# Patient Record
Sex: Female | Born: 1960 | Race: Black or African American | Hispanic: No | Marital: Married | Smoking: Never smoker
Health system: Southern US, Community
[De-identification: ages and names within clinical notes are randomized; demographics above are authoritative.]

## PROBLEM LIST (undated history)

## (undated) DIAGNOSIS — M1990 Unspecified osteoarthritis, unspecified site: Secondary | ICD-10-CM

## (undated) DIAGNOSIS — B019 Varicella without complication: Secondary | ICD-10-CM

## (undated) DIAGNOSIS — I5189 Other ill-defined heart diseases: Secondary | ICD-10-CM

## (undated) DIAGNOSIS — I10 Essential (primary) hypertension: Secondary | ICD-10-CM

## (undated) DIAGNOSIS — R0989 Other specified symptoms and signs involving the circulatory and respiratory systems: Secondary | ICD-10-CM

## (undated) DIAGNOSIS — G43909 Migraine, unspecified, not intractable, without status migrainosus: Secondary | ICD-10-CM

## (undated) HISTORY — DX: Other specified symptoms and signs involving the circulatory and respiratory systems: R09.89

## (undated) HISTORY — DX: Other ill-defined heart diseases: I51.89

## (undated) HISTORY — DX: Essential (primary) hypertension: I10

## (undated) HISTORY — DX: Varicella without complication: B01.9

## (undated) HISTORY — DX: Migraine, unspecified, not intractable, without status migrainosus: G43.909

## (undated) HISTORY — PX: BREAST CYST ASPIRATION: SHX578

## (undated) HISTORY — PX: TUBAL LIGATION: SHX77

## (undated) HISTORY — DX: Unspecified osteoarthritis, unspecified site: M19.90

---

## 2004-02-29 ENCOUNTER — Ambulatory Visit: Payer: Self-pay | Admitting: Obstetrics and Gynecology

## 2007-04-08 ENCOUNTER — Emergency Department (HOSPITAL_COMMUNITY): Admission: EM | Admit: 2007-04-08 | Discharge: 2007-04-08 | Payer: Self-pay | Admitting: Family Medicine

## 2007-07-22 ENCOUNTER — Emergency Department (HOSPITAL_COMMUNITY): Admission: EM | Admit: 2007-07-22 | Discharge: 2007-07-22 | Payer: Self-pay | Admitting: Emergency Medicine

## 2010-03-02 ENCOUNTER — Encounter: Payer: Self-pay | Admitting: Internal Medicine

## 2010-08-28 ENCOUNTER — Ambulatory Visit (INDEPENDENT_AMBULATORY_CARE_PROVIDER_SITE_OTHER): Payer: 59 | Admitting: Cardiovascular Disease

## 2010-08-28 ENCOUNTER — Encounter: Payer: Self-pay | Admitting: Cardiovascular Disease

## 2010-08-28 DIAGNOSIS — R0989 Other specified symptoms and signs involving the circulatory and respiratory systems: Secondary | ICD-10-CM | POA: Insufficient documentation

## 2010-08-28 DIAGNOSIS — R011 Cardiac murmur, unspecified: Secondary | ICD-10-CM | POA: Insufficient documentation

## 2010-08-28 NOTE — Patient Instructions (Addendum)
You are doing well. No medication changes were made. We will schedule an echocardiogram to look for the cause of your murmur and bruit. Please call us if you have new issues that need to be addressed   Your physician has requested that you have an echocardiogram. Echocardiography is a painless test that uses sound waves to create images of your heart. It provides your doctor with information about the size and shape of your heart and how well your heart's chambers and valves are working. This procedure takes approximately one hour. There are no restrictions for this procedure.

## 2010-08-28 NOTE — Assessment & Plan Note (Signed)
There is clearly a murmur appreciated on exam though uncertain if this is an outflow tract is a flow murmur or something else. She does not have any significant symptoms. Murmur extends into the sternal notch. Echocardiogram has been ordered.

## 2010-08-28 NOTE — Assessment & Plan Note (Signed)
Her bruit seems to extend into the sternal notch and upwards. We have ordered a echocardiogram to evaluate her valves and source of murmur and look into the subclavian and basal carotid region.

## 2010-08-28 NOTE — Progress Notes (Signed)
Patient ID: Tara Marshall, female    DOB: August 29, 1960, 50 y.o.   MRN: [id number]  HPI Comments: Tara Marshall is a very pleasant 50 year old woman with a history of vitamin D deficiency, patient of Dr. Carlynn Purl, who presents by referral for evaluation of a murmur and bruit.  She reports that she has had some swelling in her right foot. This gets worse if she spends long periods on her feet. She does have occasional palpitations. She also feels sometimes having to take a deep breath though no shortness of breath with exertion. Her level of activity has been stable, and she is able to do everything that she would like to do. No lightheadedness or dizziness. Otherwise she feels well.  EKG shows normal sinus rhythm with rate 72 beats per minute with no significant ST or T wave changes  She does report that her mother had syncope and heart attack at age 35, father died at age 9, possibly from heart related issue   Outpatient Encounter Prescriptions as of 08/28/2010  Medication Sig Dispense Refill  . calcium carbonate (OS-CAL) 600 MG TABS Take 600 mg by mouth daily.        . Cholecalciferol (VITAMIN D HIGH POTENCY) 1000 UNITS capsule Take 1,000 Units by mouth daily.        . meloxicam (MOBIC) 15 MG tablet Take 15 mg by mouth daily.        . Omega-3 Fatty Acids (FISH OIL) 1000 MG CAPS Take 1,000 mg by mouth 1 day or 1 dose.           Review of Systems  Constitutional: Negative.   HENT: Negative.   Eyes: Negative.   Respiratory: Negative.   Cardiovascular: Negative.        Right foot swelling, occasional shortness of breath at rest  Gastrointestinal: Negative.   Musculoskeletal: Negative.   Skin: Negative.   Neurological: Negative.   Hematological: Negative.   Psychiatric/Behavioral: Negative.   All other systems reviewed and are negative.    BP 152/91  Pulse 91  Ht 5\' 4"  (1.626 m)  Wt 220 lb (99.791 kg)  BMI 37.76 kg/m2   Physical Exam  Nursing note and vitals  reviewed. Constitutional: She is oriented to person, place, and time. She appears well-developed and well-nourished.  HENT:  Head: Normocephalic.  Nose: Nose normal.  Mouth/Throat: Oropharynx is clear and moist.  Eyes: Conjunctivae are normal. Pupils are equal, round, and reactive to light.  Neck: Normal range of motion. Neck supple. No JVD present.  Cardiovascular: Normal rate, regular rhythm, S1 normal, S2 normal and intact distal pulses.  Exam reveals no gallop and no friction rub.   Murmur heard.  Crescendo systolic murmur is present with a grade of 2/6       Murmur appreciated best at the sternal notch, extending into the left clavicular region  Pulmonary/Chest: Effort normal and breath sounds normal. No respiratory distress. She has no wheezes. She has no rales. She exhibits no tenderness.  Abdominal: Soft. Bowel sounds are normal. She exhibits no distension. There is no tenderness.  Musculoskeletal: Normal range of motion. She exhibits no edema and no tenderness.  Lymphadenopathy:    She has no cervical adenopathy.  Neurological: She is alert and oriented to person, place, and time. Coordination normal.  Skin: Skin is warm and dry. No rash noted. No erythema.  Psychiatric: She has a normal mood and affect. Her behavior is normal. Judgment and thought content normal.  Assessment and Plan

## 2010-09-12 ENCOUNTER — Other Ambulatory Visit (INDEPENDENT_AMBULATORY_CARE_PROVIDER_SITE_OTHER): Payer: 59 | Admitting: *Deleted

## 2010-09-12 DIAGNOSIS — R011 Cardiac murmur, unspecified: Secondary | ICD-10-CM

## 2010-09-12 DIAGNOSIS — R0989 Other specified symptoms and signs involving the circulatory and respiratory systems: Secondary | ICD-10-CM

## 2010-09-24 ENCOUNTER — Telehealth: Payer: Self-pay | Admitting: Cardiovascular Disease

## 2010-09-24 NOTE — Telephone Encounter (Signed)
Would like results of ECHO.

## 2010-09-25 NOTE — Telephone Encounter (Signed)
Please review echo and advise, thanks.

## 2011-08-11 DIAGNOSIS — M19079 Primary osteoarthritis, unspecified ankle and foot: Secondary | ICD-10-CM | POA: Insufficient documentation

## 2011-08-11 DIAGNOSIS — M549 Dorsalgia, unspecified: Secondary | ICD-10-CM | POA: Insufficient documentation

## 2011-08-11 DIAGNOSIS — M722 Plantar fascial fibromatosis: Secondary | ICD-10-CM | POA: Insufficient documentation

## 2012-05-24 ENCOUNTER — Ambulatory Visit: Payer: Self-pay | Admitting: Gastroenterology

## 2014-06-11 LAB — HM COLONOSCOPY

## 2014-07-12 ENCOUNTER — Ambulatory Visit (INDEPENDENT_AMBULATORY_CARE_PROVIDER_SITE_OTHER)
Admission: RE | Admit: 2014-07-12 | Discharge: 2014-07-12 | Disposition: A | Discharge status: Home / Self Care | Payer: Managed Care, Other (non HMO) | Source: Ambulatory Visit | Attending: Primary Care | Admitting: Primary Care

## 2014-07-12 ENCOUNTER — Encounter: Payer: Self-pay | Admitting: Primary Care

## 2014-07-12 ENCOUNTER — Ambulatory Visit (INDEPENDENT_AMBULATORY_CARE_PROVIDER_SITE_OTHER): Payer: Managed Care, Other (non HMO) | Admitting: Primary Care

## 2014-07-12 VITALS — BP 160/102 | HR 67 | Temp 98.0°F | Ht 64.0 in | Wt 233.8 lb

## 2014-07-12 DIAGNOSIS — I10 Essential (primary) hypertension: Secondary | ICD-10-CM

## 2014-07-12 DIAGNOSIS — M5442 Lumbago with sciatica, left side: Secondary | ICD-10-CM

## 2014-07-12 HISTORY — DX: Lumbago with sciatica, left side: M54.42

## 2014-07-12 LAB — BASIC METABOLIC PANEL
BUN: 12 mg/dL (ref 6–23)
CALCIUM: 9.6 mg/dL (ref 8.4–10.5)
CO2: 30 mEq/L (ref 19–32)
Chloride: 103 mEq/L (ref 96–112)
Creatinine, Ser: 0.8 mg/dL (ref 0.40–1.20)
GFR: 95.34 mL/min (ref >60.00)
Glucose, Bld: 74 mg/dL (ref 70–99)
Potassium: 3.8 mEq/L (ref 3.5–5.1)
SODIUM: 139 meq/L (ref 135–145)

## 2014-07-12 MED ORDER — HYDROCHLOROTHIAZIDE 25 MG PO TABS: 25.0000 mg | ORAL_TABLET | Freq: Every day | ORAL | Status: DC

## 2014-07-12 NOTE — Assessment & Plan Note (Signed)
Reports historically elevated in past. Elevated today in clinic. Due to obesity and historical readings will start medication. HCTZ 25 mg daily as this should help with lower extremity edema. Discouraged diet pills and suggested a lower dose ibuprofen as this is likely contributing to her hypertension. BMP unremarkable today. Follow up in 2 weeks.

## 2014-07-12 NOTE — Progress Notes (Signed)
Pre visit review using our clinic review tool, if applicable. No additional management support is needed unless otherwise documented below in the visit note. 

## 2014-07-12 NOTE — Patient Instructions (Addendum)
Start Hydrochlorothiazide tablets for blood pressure. Take 1 tablet by mouth daily. Complete xray(s) prior to leaving today. I will contact you regarding your results. Complete lab work prior to leaving today. I will notify you of your results. It is important that you improve your diet. Please limit carbohydrates in the form of white bread, rice, pasta, cakes, cookies, sugary drinks, etc. Increase your consumption of fresh fruits and vegetables. Be sure to drink plenty of water daily.  Follow up in 2 weeks for evaluation of blood pressure.  It was a pleasure to meet you today! Please don't hesitate to call me with any questions. Welcome to [facility]!  Hypertension Hypertension, commonly called high blood pressure, is when the force of blood pumping through your arteries is too strong. Your arteries are the blood vessels that carry blood from your heart throughout your body. A blood pressure reading consists of a higher number over a lower number, such as 110/72. The higher number (systolic) is the pressure inside your arteries when your heart pumps. The lower number (diastolic) is the pressure inside your arteries when your heart relaxes. Ideally you want your blood pressure below 120/80. Hypertension forces your heart to work harder to pump blood. Your arteries may become narrow or stiff. Having hypertension puts you at risk for heart disease, stroke, and other problems.  RISK FACTORS Some risk factors for high blood pressure are controllable. Others are not.  Risk factors you cannot control include:   Race. You may be at higher risk if you are African American.  Age. Risk increases with age.  Gender. Men are at higher risk than women before age 56 years. After age 56, women are at higher risk than men. Risk factors you can control include:  Not getting enough exercise or physical activity.  Being overweight.  Getting too much fat, sugar, calories, or salt in your diet.  Drinking too  much alcohol. SIGNS AND SYMPTOMS Hypertension does not usually cause signs or symptoms. Extremely high blood pressure (hypertensive crisis) may cause headache, anxiety, shortness of breath, and nosebleed. DIAGNOSIS  To check if you have hypertension, your health care provider will measure your blood pressure while you are seated, with your arm held at the level of your heart. It should be measured at least twice using the same arm. Certain conditions can cause a difference in blood pressure between your right and left arms. A blood pressure reading that is higher than normal on one occasion does not mean that you need treatment. If one blood pressure reading is high, ask your health care provider about having it checked again. TREATMENT  Treating high blood pressure includes making lifestyle changes and possibly taking medicine. Living a healthy lifestyle can help lower high blood pressure. You may need to change some of your habits. Lifestyle changes may include:  Following the DASH diet. This diet is high in fruits, vegetables, and whole grains. It is low in salt, red meat, and added sugars.  Getting at least 2 hours of brisk physical activity every week.  Losing weight if necessary.  Not smoking.  Limiting alcoholic beverages.  Learning ways to reduce stress. If lifestyle changes are not enough to get your blood pressure under control, your health care provider may prescribe medicine. You may need to take more than one. Work closely with your health care provider to understand the risks and benefits. HOME CARE INSTRUCTIONS  Have your blood pressure rechecked as directed by your health care provider.   Take  medicines only as directed by your health care provider. Follow the directions carefully. Blood pressure medicines must be taken as prescribed. The medicine does not work as well when you skip doses. Skipping doses also puts you at risk for problems.   Do not smoke.   Monitor  your blood pressure at home as directed by your health care provider. SEEK MEDICAL CARE IF:   You think you are having a reaction to medicines taken.  You have recurrent headaches or feel dizzy.  You have swelling in your ankles.  You have trouble with your vision. SEEK IMMEDIATE MEDICAL CARE IF:  You develop a severe headache or confusion.  You have unusual weakness, numbness, or feel faint.  You have severe chest or abdominal pain.  You vomit repeatedly.  You have trouble breathing. MAKE SURE YOU:   Understand these instructions.  Will watch your condition.  Will get help right away if you are not doing well or get worse. Document Released: 01/26/2005 Document Revised: 06/12/2013 Document Reviewed: 11/18/2012 [facility] Patient Information 2015 [place], [place]. This information is not intended to replace advice given to you by your health care provider. Make sure you discuss any questions you have with your health care provider.

## 2014-07-12 NOTE — Progress Notes (Signed)
Subjective:    Patient ID: Tara Marshall, female    DOB: 04/22/1960, 54 y.o.   MRN: [id number]  HPI  Ms. Tara Marshall is a 54 year old female who presents today to establish care and discuss the problems mentioned below. Will obtain old records.  1) Elevated blood pressure readings: She's started taking over the counter diet pills this past Monday. She's also been taking ibuprofen and OTC "joint medication" every 6 hours consistently for several years for her joint pain. She's had elevated readings in the past and was treated with medication for a short period of time. Last BP check was one year ago and reports it was not normal. She does not check her BP at home.  BP Readings from Last 3 Encounters:  07/12/14 160/102  08/28/10 152/91    2) Leg pain: Present to left leg throughout the day and will experience worse pain with radiation down left leg when laying down. She describes her pain as "achy".She also reports pain to right 4th and 5th metatarsals with radiation to ankle. She's also noticed swelling to right ankle 1 week ago. She's taking ibuprofen 800 mg in the morning, 800 mg in the evening and Motrin PM at night for the past several years and moreso especially in the past several weeks. She will experience left lower back cramping. She was once told that she had a torn tendon to her right leg and was recommended she undergo surgery for which she declined.  3) Obesity: Steady weight gain over the years. She tries to walk 3 laps around a local park daily. She's just restarted Garsina Cambogia diet pills on Monday. She's been on diet pills intermittently throughout the years. Her diet consists of: Breakfast: cereal, toast with butter Lunch: crackers, grapes Dinner: fish, chicken, vegetables Drinks: sodas (1-2 daily), mostly sweet tea, some water.  Body mass index is 40.11 kg/(m^2).   Review of Systems  Constitutional: Negative for unexpected weight change.  HENT: Negative for rhinorrhea.    Respiratory: Negative for cough and shortness of breath.   Cardiovascular: Negative for chest pain.  Gastrointestinal: Negative for diarrhea and constipation.  Genitourinary: Negative for dysuria and frequency.  Musculoskeletal: Positive for arthralgias.  Skin: Negative for rash.  Neurological: Negative for dizziness and headaches.  Psychiatric/Behavioral:       Denies concerns for anxiety or depression       Past Medical History  Diagnosis Date  . Carotid bruit     right   . Other ill-defined heart disease   . Arthritis   . Chicken pox   . Hypertension   . Migraine     History   Social History  . Marital Status: Married    Spouse Name: N/A  . Number of Children: N/A  . Years of Education: N/A   Occupational History  . Not on file.   Social History Main Topics  . Smoking status: Never Smoker   . Smokeless tobacco: Not on file  . Alcohol Use: 0.6 oz/week    1 Standard drinks or equivalent per week  . Drug Use: No  . Sexual Activity: Not on file   Other Topics Concern  . Not on file   Social History Narrative   Married.   3 children   Highest level of education 12th grade.   Works as a Arts development officerhome maker.   Enjoys playing on the computer, walking, organizing.    Past Surgical History  Procedure Laterality Date  . Cesarean section    .  Tubal ligation      Family History  Problem Relation Age of Onset  . Heart attack Mother   . Hypertension Mother     No Known Allergies  Current Outpatient Prescriptions on File Prior to Visit  Medication Sig Dispense Refill  . calcium carbonate (OS-CAL) 600 MG TABS Take 600 mg by mouth daily.      . Cholecalciferol (VITAMIN D HIGH POTENCY) 1000 UNITS capsule Take 1,000 Units by mouth daily.      . Omega-3 Fatty Acids (FISH OIL) 1000 MG CAPS Take 1,000 mg by mouth 1 day or 1 dose.      . meloxicam (MOBIC) 15 MG tablet Take 15 mg by mouth daily.       No current facility-administered medications on file prior to visit.      BP 160/102 mmHg  Pulse 67  Temp(Src) 98 F (36.7 C) (Oral)  Ht  (1.626 m)  Wt 233 lb 12.8 oz (106.051 kg)  BMI 40.11 kg/m2  SpO2 97%    Objective:   Physical Exam  Constitutional: She is oriented to person, place, and time. She appears well-nourished.  HENT:  Head: Normocephalic.  Cardiovascular: Normal rate and regular rhythm.   Pulmonary/Chest: Effort normal and breath sounds normal.  Musculoskeletal: She exhibits no edema.       Left upper leg: She exhibits no tenderness and no swelling.       Left lower leg: She exhibits no tenderness and no swelling.  No pain on PROM. Negative straight leg raise.  Neurological: She is alert and oriented to person, place, and time.  Skin: Skin is warm and dry.  Psychiatric: She has a normal mood and affect.          Assessment & Plan:

## 2014-07-12 NOTE — Assessment & Plan Note (Signed)
Present for years. Takes daily ibuprofen with some relief. Xrays today of lumbar spine which showed disc degeneration to L4-L5, L5-S1. Pain also due to body habitus and explained she needed to lose weight. Offered physical therapy, awaiting reply.

## 2014-07-26 ENCOUNTER — Ambulatory Visit: Payer: Managed Care, Other (non HMO) | Admitting: Primary Care

## 2014-08-06 ENCOUNTER — Encounter: Payer: Self-pay | Admitting: Primary Care

## 2014-08-06 ENCOUNTER — Ambulatory Visit (INDEPENDENT_AMBULATORY_CARE_PROVIDER_SITE_OTHER): Payer: Managed Care, Other (non HMO) | Admitting: Primary Care

## 2014-08-06 VITALS — BP 142/84 | HR 74 | Temp 97.4°F | Ht 64.0 in | Wt 227.8 lb

## 2014-08-06 DIAGNOSIS — I10 Essential (primary) hypertension: Secondary | ICD-10-CM | POA: Diagnosis not present

## 2014-08-06 DIAGNOSIS — M25569 Pain in unspecified knee: Secondary | ICD-10-CM

## 2014-08-06 HISTORY — DX: Pain in unspecified knee: M25.569

## 2014-08-06 MED ORDER — AMLODIPINE BESYLATE 5 MG PO TABS: 5.0000 mg | ORAL_TABLET | Freq: Every day | ORAL | Status: DC

## 2014-08-06 NOTE — Assessment & Plan Note (Signed)
Improved. Not yet at goal. Continue HCTZ 25 mg, add Amlodipine 5 mg daily. Continue to check BP throughout the week. Discussed for her to call if she felt dizziness or had a readings below 100/60. Follow up in 4 weeks for re-evaluation.

## 2014-08-06 NOTE — Progress Notes (Signed)
Subjective:    Patient ID: Tara Marshall, female    DOB: 03-11-1960, 53 y.o.   MRN: [id number]  HPI  Tara Marshall is a 54 year old female who presents today for follow up.  She was evaluated on 07/12/14 as a new patient and noted to have high blood pressure. She takes diet pills and ibuprofen regularly. This was discouraged as it was likely contributing to her elevated readings. She was once managed on HCTZ for elevated readings, and was restarted last visit at this same dose. Since the initiation of her medication her blood pressure has decreased as well as her lower extremity edema. She's been checking her blood pressure at [employer] and has been getting 150's-140/80's. Reports headaches have improved, denies chest pain.   BP Readings from Last 3 Encounters:  08/06/14 142/84  07/12/14 160/102  08/28/10 152/91   2) Left knee pain: Present since February 2016. Pain is worst after laying still for a prolonged amount of time. She has recently taken tylenol arthritis with relief. She is also taking Meloxicam for low back pain.   Wt Readings from Last 3 Encounters:  08/06/14 227 lb 12.8 oz (103.329 kg)  07/12/14 233 lb 12.8 oz (106.051 kg)  08/28/10 220 lb (99.791 kg)     Review of Systems  Respiratory: Negative for shortness of breath.   Cardiovascular: Negative for chest pain and leg swelling.  Musculoskeletal: Positive for arthralgias.  Neurological: Negative for dizziness.       Improvement in headaches       Past Medical History  Diagnosis Date  . Carotid bruit     right   . Other ill-defined heart disease   . Arthritis   . Chicken pox   . Hypertension   . Migraine     History   Social History  . Marital Status: Married    Spouse Name: N/A  . Number of Children: N/A  . Years of Education: N/A   Occupational History  . Not on file.   Social History Main Topics  . Smoking status: Never Smoker   . Smokeless tobacco: Not on file  . Alcohol Use: 0.6 oz/week    1  Standard drinks or equivalent per week  . Drug Use: No  . Sexual Activity: Not on file   Other Topics Concern  . Not on file   Social History Narrative   Married.   3 children   Highest level of education 12th grade.   Works as a Arts development officer.   Enjoys playing on the computer, walking, organizing.    Past Surgical History  Procedure Laterality Date  . Cesarean section    . Tubal ligation      Family History  Problem Relation Age of Onset  . Heart attack Mother   . Hypertension Mother     No Known Allergies  Current Outpatient Prescriptions on File Prior to Visit  Medication Sig Dispense Refill  . calcium carbonate (OS-CAL) 600 MG TABS Take 600 mg by mouth daily.      . Cholecalciferol (VITAMIN D HIGH POTENCY) 1000 UNITS capsule Take 1,000 Units by mouth daily.      Marland Kitchen glucosamine-chondroitin 500-400 MG tablet Take 2 tablets by mouth 3 (three) times daily.    . hydrochlorothiazide (HYDRODIURIL) 25 MG tablet Take 1 tablet (25 mg total) by mouth daily. 90 tablet 3  . Omega-3 Fatty Acids (FISH OIL) 1000 MG CAPS Take 1,000 mg by mouth 1 day or 1 dose.      Marland Kitchen  meloxicam (MOBIC) 15 MG tablet Take 15 mg by mouth daily.       No current facility-administered medications on file prior to visit.    BP 142/84 mmHg  Pulse 74  Temp(Src) 97.4 F (36.3 C) (Oral)  Ht  (1.626 m)  Wt 227 lb 12.8 oz (103.329 kg)  BMI 39.08 kg/m2  SpO2 97%    Objective:   Physical Exam  Cardiovascular: Normal rate and regular rhythm.   Pulmonary/Chest: Effort normal and breath sounds normal.  Musculoskeletal: Normal range of motion. She exhibits no edema or tenderness.  Patella and ligaments stable on exam.  Skin: Skin is warm and dry.          Assessment & Plan:

## 2014-08-06 NOTE — Assessment & Plan Note (Signed)
Left knee with some crepitus intermittently. Suspect arthritis. Discussed importance of weight loss and to avoid laying still for a prolonged amount of time. Improved with tylenol arthritis. Will continue to monitor.

## 2014-08-06 NOTE — Progress Notes (Signed)
Pre visit review using our clinic review tool, if applicable. No additional management support is needed unless otherwise documented below in the visit note. 

## 2014-08-06 NOTE — Patient Instructions (Signed)
Start Amlodipine 5 mg for blood pressure. Take 1 tablet by mouth daily.  Continue to measure your blood pressure at [employer], take note of your readings. Call me if you get any reading below 100/60.  Follow up in 4 weeks for re-evaluation.  It was nice seeing you!  Hypertension Hypertension, commonly called high blood pressure, is when the force of blood pumping through your arteries is too strong. Your arteries are the blood vessels that carry blood from your heart throughout your body. A blood pressure reading consists of a higher number over a lower number, such as 110/72. The higher number (systolic) is the pressure inside your arteries when your heart pumps. The lower number (diastolic) is the pressure inside your arteries when your heart relaxes. Ideally you want your blood pressure below 120/80. Hypertension forces your heart to work harder to pump blood. Your arteries may become narrow or stiff. Having hypertension puts you at risk for heart disease, stroke, and other problems.  RISK FACTORS Some risk factors for high blood pressure are controllable. Others are not.  Risk factors you cannot control include:   Race. You may be at higher risk if you are African American.  Age. Risk increases with age.  Gender. Men are at higher risk than women before age 90 years. After age 51, women are at higher risk than men. Risk factors you can control include:  Not getting enough exercise or physical activity.  Being overweight.  Getting too much fat, sugar, calories, or salt in your diet.  Drinking too much alcohol. SIGNS AND SYMPTOMS Hypertension does not usually cause signs or symptoms. Extremely high blood pressure (hypertensive crisis) may cause headache, anxiety, shortness of breath, and nosebleed. DIAGNOSIS  To check if you have hypertension, your health care provider will measure your blood pressure while you are seated, with your arm held at the level of your heart. It should be  measured at least twice using the same arm. Certain conditions can cause a difference in blood pressure between your right and left arms. A blood pressure reading that is higher than normal on one occasion does not mean that you need treatment. If one blood pressure reading is high, ask your health care provider about having it checked again. TREATMENT  Treating high blood pressure includes making lifestyle changes and possibly taking medicine. Living a healthy lifestyle can help lower high blood pressure. You may need to change some of your habits. Lifestyle changes may include:  Following the DASH diet. This diet is high in fruits, vegetables, and whole grains. It is low in salt, red meat, and added sugars.  Getting at least 2 hours of brisk physical activity every week.  Losing weight if necessary.  Not smoking.  Limiting alcoholic beverages.  Learning ways to reduce stress. If lifestyle changes are not enough to get your blood pressure under control, your health care provider may prescribe medicine. You may need to take more than one. Work closely with your health care provider to understand the risks and benefits. HOME CARE INSTRUCTIONS  Have your blood pressure rechecked as directed by your health care provider.   Take medicines only as directed by your health care provider. Follow the directions carefully. Blood pressure medicines must be taken as prescribed. The medicine does not work as well when you skip doses. Skipping doses also puts you at risk for problems.   Do not smoke.   Monitor your blood pressure at home as directed by your health care provider.  SEEK MEDICAL CARE IF:   You think you are having a reaction to medicines taken.  You have recurrent headaches or feel dizzy.  You have swelling in your ankles.  You have trouble with your vision. SEEK IMMEDIATE MEDICAL CARE IF:  You develop a severe headache or confusion.  You have unusual weakness, numbness,  or feel faint.  You have severe chest or abdominal pain.  You vomit repeatedly.  You have trouble breathing. MAKE SURE YOU:   Understand these instructions.  Will watch your condition.  Will get help right away if you are not doing well or get worse. Document Released: 01/26/2005 Document Revised: 06/12/2013 Document Reviewed: 11/18/2012 [facility] Patient Information 2015 [place], [place]. This information is not intended to replace advice given to you by your health care provider. Make sure you discuss any questions you have with your health care provider.

## 2014-08-09 ENCOUNTER — Ambulatory Visit: Payer: Managed Care, Other (non HMO) | Admitting: Primary Care

## 2014-09-04 ENCOUNTER — Ambulatory Visit: Payer: Managed Care, Other (non HMO) | Admitting: Primary Care

## 2014-10-18 ENCOUNTER — Ambulatory Visit: Payer: Managed Care, Other (non HMO) | Admitting: Primary Care

## 2014-10-25 ENCOUNTER — Ambulatory Visit (INDEPENDENT_AMBULATORY_CARE_PROVIDER_SITE_OTHER): Payer: Managed Care, Other (non HMO) | Admitting: Primary Care

## 2014-10-25 ENCOUNTER — Encounter: Payer: Self-pay | Admitting: Primary Care

## 2014-10-25 VITALS — BP 144/94 | HR 74 | Temp 98.1°F | Ht 64.0 in | Wt 227.4 lb

## 2014-10-25 DIAGNOSIS — I10 Essential (primary) hypertension: Secondary | ICD-10-CM

## 2014-10-25 MED ORDER — AMLODIPINE BESYLATE 10 MG PO TABS: 10.0000 mg | ORAL_TABLET | Freq: Every day | ORAL | Status: DC

## 2014-10-25 NOTE — Assessment & Plan Note (Signed)
Managed on HCTZ and amlodipin 5 mg (added last visit). She was supposed to follow up in early July, but did not. BP elevated today in clinic and also from her [employer] readings. Increase Amlodipine to 10 mg, continue HCTZ 25 mg. Follow up in 3 months

## 2014-10-25 NOTE — Progress Notes (Signed)
Pre visit review using our clinic review tool, if applicable. No additional management support is needed unless otherwise documented below in the visit note. 

## 2014-10-25 NOTE — Patient Instructions (Addendum)
Stop Amlodipine 5 mg tablets. Start Amlodipine 10 mg tablets for blood pressure. You may take 2 of the 5 mg tablets if you have some left in your current bottle.  Continue taking the hydrochlorothiazide tablets for blood pressure.   Check your blood pressure daily for the next 2 weeks. After 2 weeks, call in your results to our office.  It is important that you improve your diet. Please limit carbohydrates in the form of white bread, rice, pasta, cakes, cookies, sugary drinks, etc. Increase your consumption of fresh fruits and vegetables. Be sure to drink plenty of water daily.  You need 1 hour of moderate intensity exercise 5 days a week.  Please schedule a physical with me in the next 3 months. You will also schedule a lab only appointment one week prior. We will discuss your lab results during your physical.  It was a pleasure to see you today!

## 2014-10-25 NOTE — Progress Notes (Signed)
Subjective:    Patient ID: Tara Marshall, female    DOB: 08-27-1960, 54 y.o.   MRN: [id number]  HPI  Tara Marshall is a 54 year old female who presents today for follow up of hypertension. She was evaluated in June 2016 for hypertension. She is taking HCTZ 25 mg and Amlodipine 5 mg, which was added last visit. She was to follow up in 4 weeks from June 2016. Her BP is elevated in the clinic today.  Since last visit she feels well overall. She has experienced headaches. She checks her BP at [facility] and is getting 130's-140's systolic. She has been taking her medication regularly.    BP Readings from Last 3 Encounters:  10/25/14 144/94  08/06/14 142/84  07/12/14 160/102   Wt Readings from Last 3 Encounters:  10/25/14 227 lb 6.4 oz (103.148 kg)  08/06/14 227 lb 12.8 oz (103.329 kg)  07/12/14 233 lb 12.8 oz (106.051 kg)     Review of Systems  Respiratory: Negative for shortness of breath.   Cardiovascular: Negative for chest pain.  Neurological: Positive for headaches. Negative for dizziness.       Past Medical History  Diagnosis Date  . Carotid bruit     right   . Other ill-defined heart disease   . Arthritis   . Chicken pox   . Hypertension   . Migraine     Social History   Social History  . Marital Status: Married    Spouse Name: N/A  . Number of Children: N/A  . Years of Education: N/A   Occupational History  . Not on file.   Social History Main Topics  . Smoking status: Never Smoker   . Smokeless tobacco: Not on file  . Alcohol Use: 0.6 oz/week    1 Standard drinks or equivalent per week  . Drug Use: No  . Sexual Activity: Not on file   Other Topics Concern  . Not on file   Social History Narrative   Married.   3 children   Highest level of education 12th grade.   Works as a Arts development officer.   Enjoys playing on the computer, walking, organizing.    Past Surgical History  Procedure Laterality Date  . Cesarean section    . Tubal ligation       Family History  Problem Relation Age of Onset  . Heart attack Mother   . Hypertension Mother     No Known Allergies  Current Outpatient Prescriptions on File Prior to Visit  Medication Sig Dispense Refill  . calcium carbonate (OS-CAL) 600 MG TABS Take 600 mg by mouth daily.      . Cholecalciferol (VITAMIN D HIGH POTENCY) 1000 UNITS capsule Take 1,000 Units by mouth daily.      . hydrochlorothiazide (HYDRODIURIL) 25 MG tablet Take 1 tablet (25 mg total) by mouth daily. 90 tablet 3  . meloxicam (MOBIC) 15 MG tablet Take 15 mg by mouth daily.      Marland Kitchen glucosamine-chondroitin 500-400 MG tablet Take 2 tablets by mouth 3 (three) times daily.    . Omega-3 Fatty Acids (FISH OIL) 1000 MG CAPS Take 1,000 mg by mouth 1 day or 1 dose.       No current facility-administered medications on file prior to visit.    BP 144/94 mmHg  Pulse 74  Temp(Src) 98.1 F (36.7 C) (Oral)  Ht 5\' 4"  (1.626 m)  Wt 227 lb 6.4 oz (103.148 kg)  BMI 39.01 kg/m2  Objective:   Physical Exam  Constitutional: She appears well-nourished.  Cardiovascular: Normal rate and regular rhythm.   No ankle edema  Pulmonary/Chest: Effort normal and breath sounds normal.  Skin: Skin is warm and dry.  Psychiatric: She has a normal mood and affect.          Assessment & Plan:

## 2014-11-08 ENCOUNTER — Telehealth: Payer: Self-pay | Admitting: Primary Care

## 2014-11-08 NOTE — Telephone Encounter (Signed)
Tried to call patient but phone kept ringing. Tried this morning and this afternoon.

## 2014-11-08 NOTE — Telephone Encounter (Signed)
Will you find out how Tara Marshall's BP readings have been since we increased her Amlodipine? Thanks.

## 2014-11-09 ENCOUNTER — Telehealth: Payer: Self-pay | Admitting: Primary Care

## 2014-11-09 NOTE — Telephone Encounter (Signed)
Pt dropped off BP readings. Log on Chan's desk. Thank you

## 2014-11-09 NOTE — Telephone Encounter (Signed)
Placed in Kate's inbox. 

## 2014-11-09 NOTE — Telephone Encounter (Signed)
Please thank Tara Marshall for these readings. They seem improved, but will need close attention. Please have her record her BP readings twice weekly and bring them to her next appointment. I will need to see her in about 6 weeks, will you please schedule? Thanks.

## 2014-11-09 NOTE — Telephone Encounter (Signed)
Called and notified patient of Kate's comments. Patient verbalized understanding. Follow up apt schedule for 12/14/14

## 2014-12-14 ENCOUNTER — Ambulatory Visit (INDEPENDENT_AMBULATORY_CARE_PROVIDER_SITE_OTHER): Payer: Managed Care, Other (non HMO) | Admitting: Primary Care

## 2014-12-14 ENCOUNTER — Encounter: Payer: Self-pay | Admitting: Primary Care

## 2014-12-14 VITALS — BP 118/76 | HR 68 | Temp 98.0°F | Ht 64.0 in | Wt 212.8 lb

## 2014-12-14 DIAGNOSIS — I10 Essential (primary) hypertension: Secondary | ICD-10-CM | POA: Diagnosis not present

## 2014-12-14 NOTE — Assessment & Plan Note (Addendum)
Improved on Amlodipine 10 mg. Continue this and HCTZ 25mg  Suspect headaches may be due to environmental allergies. Will have her try daily claritin for 4 weeks. She is to follow up if no improvement. Follow up in 3 months.

## 2014-12-14 NOTE — Progress Notes (Signed)
Subjective:    Patient ID: Tara Marshall, female    DOB: 07/01/1960, 54 y.o.   MRN: [id number]  HPI  Tara Marshall is a 54 year old female who presents today for follow up of hypertension. Her Amlodipine was increase last visit to 10 mg. She is also taking HCTZ 25 mg.   Since her last visit her BP is improved. She's checking her blood pressure twice weekly and is getting readings of 120's/70's. Denies chest pain, dizziness. She continues to experience headaches. Her headaches are daily and are noticeable after her morning walk. Her headaches historically are present around the fall and will improve during the spring and summer months. She's also working to improve her diet and is walking nearly everyday.  BP Readings from Last 3 Encounters:  12/14/14 118/76  10/25/14 144/94  08/06/14 142/84   Wt Readings from Last 3 Encounters:  12/14/14 212 lb 12.8 oz (96.525 kg)  10/25/14 227 lb 6.4 oz (103.148 kg)  08/06/14 227 lb 12.8 oz (103.329 kg)      Review of Systems  Constitutional: Negative for fever and chills.  HENT: Negative for congestion, rhinorrhea and sinus pressure.   Respiratory: Negative for cough and shortness of breath.   Cardiovascular: Negative for chest pain.  Neurological: Positive for headaches. Negative for dizziness.       Past Medical History  Diagnosis Date  . Carotid bruit     right   . Other ill-defined heart disease   . Arthritis   . Chicken pox   . Hypertension   . Migraine     Social History   Social History  . Marital Status: Married    Spouse Name: N/A  . Number of Children: N/A  . Years of Education: N/A   Occupational History  . Not on file.   Social History Main Topics  . Smoking status: Never Smoker   . Smokeless tobacco: Not on file  . Alcohol Use: 0.6 oz/week    1 Standard drinks or equivalent per week  . Drug Use: No  . Sexual Activity: Not on file   Other Topics Concern  . Not on file   Social History Narrative   Married.    3 children   Highest level of education 12th grade.   Works as a Arts development officerhome maker.   Enjoys playing on the computer, walking, organizing.    Past Surgical History  Procedure Laterality Date  . Cesarean section    . Tubal ligation      Family History  Problem Relation Age of Onset  . Heart attack Mother   . Hypertension Mother     No Known Allergies  Current Outpatient Prescriptions on File Prior to Visit  Medication Sig Dispense Refill  . amLODipine (NORVASC) 10 MG tablet Take 1 tablet (10 mg total) by mouth daily. 30 tablet 5  . calcium carbonate (OS-CAL) 600 MG TABS Take 600 mg by mouth daily.      . Cholecalciferol (VITAMIN D HIGH POTENCY) 1000 UNITS capsule Take 1,000 Units by mouth daily.      Marland Kitchen. glucosamine-chondroitin 500-400 MG tablet Take 2 tablets by mouth 3 (three) times daily.    . hydrochlorothiazide (HYDRODIURIL) 25 MG tablet Take 1 tablet (25 mg total) by mouth daily. 90 tablet 3  . Iron-Vitamins (GERITOL COMPLETE PO) Take 1 tablet by mouth daily.    . meloxicam (MOBIC) 15 MG tablet Take 15 mg by mouth daily.      . Omega-3 Fatty  Acids (FISH OIL) 1000 MG CAPS Take 1,000 mg by mouth 1 day or 1 dose.       No current facility-administered medications on file prior to visit.    BP 118/76 mmHg  Pulse 68  Temp(Src) 98 F (36.7 C) (Oral)  Ht  (1.626 m)  Wt 212 lb 12.8 oz (96.525 kg)  BMI 36.51 kg/m2  SpO2 96%    Objective:   Physical Exam  Constitutional: She appears well-nourished.  HENT:  Right Ear: Tympanic membrane and ear canal normal.  Left Ear: Tympanic membrane and ear canal normal.  Nose: Right sinus exhibits no maxillary sinus tenderness and no frontal sinus tenderness. Left sinus exhibits no maxillary sinus tenderness and no frontal sinus tenderness.  Mouth/Throat: Oropharynx is clear and moist.  Eyes: Conjunctivae are normal. Pupils are equal, round, and reactive to light.  Neck: Neck supple.  Cardiovascular: Normal rate and regular rhythm.    Pulmonary/Chest: Effort normal and breath sounds normal.  Lymphadenopathy:    She has no cervical adenopathy.  Skin: Skin is warm and dry.          Assessment & Plan:

## 2014-12-14 NOTE — Patient Instructions (Addendum)
Start taking a daily antihistamine such as Claritin, Zyrtec, or Allegra to help with headaches. Take 1 tablet every morning or evening for the next 4 weeks.  Start taking Miralax daily to help with bowel movements.  Continue your blood pressure medications.  Congratulations on your weight loss! Keep up the good work.  It was a pleasure to see you today!

## 2014-12-14 NOTE — Progress Notes (Signed)
Pre visit review using our clinic review tool, if applicable. No additional management support is needed unless otherwise documented below in the visit note. 

## 2015-02-26 ENCOUNTER — Ambulatory Visit (INDEPENDENT_AMBULATORY_CARE_PROVIDER_SITE_OTHER): Payer: Managed Care, Other (non HMO) | Admitting: Primary Care

## 2015-02-26 ENCOUNTER — Encounter: Payer: Self-pay | Admitting: Primary Care

## 2015-02-26 VITALS — BP 124/74 | HR 76 | Temp 98.2°F | Ht 64.0 in | Wt 202.1 lb

## 2015-02-26 DIAGNOSIS — R059 Cough, unspecified: Secondary | ICD-10-CM

## 2015-02-26 DIAGNOSIS — R05 Cough: Secondary | ICD-10-CM

## 2015-02-26 MED ORDER — AZITHROMYCIN 250 MG PO TABS: ORAL_TABLET | ORAL | Status: DC

## 2015-02-26 MED ORDER — BENZONATATE 200 MG PO CAPS: 200.0000 mg | ORAL_CAPSULE | Freq: Three times a day (TID) | ORAL | Status: DC | PRN

## 2015-02-26 NOTE — Patient Instructions (Signed)
Start Azithromycin antibiotics. Take 2 tablets by mouth today, then 1 tablet daily for 4 additional days.  You may take Benzonatate capsules for cough. Take 1 capsule by mouth three times daily as needed for cough.  Increase consumption of water and rest.  It was a pleasure to see you today!   

## 2015-02-26 NOTE — Progress Notes (Signed)
Pre visit review using our clinic review tool, if applicable. No additional management support is needed unless otherwise documented below in the visit note. 

## 2015-02-26 NOTE — Progress Notes (Signed)
Subjective:    Patient ID: Tara Marshall, female    DOB: May 30, 1960, 55 y.o.   MRN: [id number]  HPI  Ms. Loose is a 55 year old female who presents today with a chief complaint of cough. She also reports nasal congestion, chest congestion, headache, dizziness. Her symptoms began about 2 weeks ago. She started to feel better last week, and then several days ago she started feeling worse. Her cough is mostly non productive. She's taken Coricidin, cough drops, cough syrup, Mucinex DM, lemon and tea, juice without improvement. She's been around her husband has been ill with the same symptoms.   Review of Systems  Constitutional: Positive for chills. Negative for fever.  HENT: Positive for congestion, postnasal drip and sore throat.   Respiratory: Positive for cough and shortness of breath.   Cardiovascular: Negative for chest pain.  Gastrointestinal: Negative for nausea.  Neurological: Positive for dizziness.       Past Medical History  Diagnosis Date  . Carotid bruit     right   . Other ill-defined heart disease   . Arthritis   . Chicken pox   . Hypertension   . Migraine     Social History   Social History  . Marital Status: Married    Spouse Name: N/A  . Number of Children: N/A  . Years of Education: N/A   Occupational History  . Not on file.   Social History Main Topics  . Smoking status: Never Smoker   . Smokeless tobacco: Not on file  . Alcohol Use: 0.6 oz/week    1 Standard drinks or equivalent per week  . Drug Use: No  . Sexual Activity: Not on file   Other Topics Concern  . Not on file   Social History Narrative   Married.   3 children   Highest level of education 12th grade.   Works as a Arts development officer.   Enjoys playing on the computer, walking, organizing.    Past Surgical History  Procedure Laterality Date  . Cesarean section    . Tubal ligation      Family History  Problem Relation Age of Onset  . Heart attack Mother   . Hypertension Mother       No Known Allergies  Current Outpatient Prescriptions on File Prior to Visit  Medication Sig Dispense Refill  . amLODipine (NORVASC) 10 MG tablet Take 1 tablet (10 mg total) by mouth daily. 30 tablet 5  . calcium carbonate (OS-CAL) 600 MG TABS Take 600 mg by mouth daily.      . Cholecalciferol (VITAMIN D HIGH POTENCY) 1000 UNITS capsule Take 1,000 Units by mouth daily.      Marland Kitchen glucosamine-chondroitin 500-400 MG tablet Take 2 tablets by mouth 3 (three) times daily.    . hydrochlorothiazide (HYDRODIURIL) 25 MG tablet Take 1 tablet (25 mg total) by mouth daily. 90 tablet 3  . Iron-Vitamins (GERITOL COMPLETE PO) Take 1 tablet by mouth daily.    . meloxicam (MOBIC) 15 MG tablet Take 15 mg by mouth daily.      . Omega-3 Fatty Acids (FISH OIL) 1000 MG CAPS Take 1,000 mg by mouth 1 day or 1 dose.       No current facility-administered medications on file prior to visit.    BP 124/74 mmHg  Pulse 76  Temp(Src) 98.2 F (36.8 C) (Oral)  Ht  (1.626 m)  Wt 202 lb 1.9 oz (91.681 kg)  BMI 34.68 kg/m2  SpO2 97%  Objective:   Physical Exam  Constitutional: She appears well-nourished.  HENT:  Right Ear: Tympanic membrane and ear canal normal.  Left Ear: Tympanic membrane and ear canal normal.  Nose: Right sinus exhibits maxillary sinus tenderness. Right sinus exhibits no frontal sinus tenderness. Left sinus exhibits maxillary sinus tenderness. Left sinus exhibits no frontal sinus tenderness.  Mouth/Throat: Oropharynx is clear and moist.  Eyes: Conjunctivae are normal.  Neck: Neck supple.  Cardiovascular: Normal rate and regular rhythm.   Pulmonary/Chest: Effort normal. She has rales.  Lymphadenopathy:    She has no cervical adenopathy.  Skin: Skin is warm and dry.          Assessment & Plan:  URI:  Cough, congestions, fatigue x 2 weeks, now worse. Exam with rales to upper lobes, otherwise exam unremarkable. Does appear ill. No improvement with OTC's. Due to duration of  symptoms and examination, will empirically treat for presumed bacterial involvement. Start Zpak, Tessalon Pearls PRN. Fluids, rest, return precautions provided.

## 2015-06-03 ENCOUNTER — Other Ambulatory Visit: Payer: Self-pay | Admitting: Primary Care

## 2015-07-05 ENCOUNTER — Encounter: Payer: Self-pay | Admitting: Primary Care

## 2015-07-05 ENCOUNTER — Ambulatory Visit (INDEPENDENT_AMBULATORY_CARE_PROVIDER_SITE_OTHER): Payer: Managed Care, Other (non HMO) | Admitting: Primary Care

## 2015-07-05 VITALS — BP 122/80 | HR 67 | Temp 98.2°F | Ht 64.0 in | Wt 200.8 lb

## 2015-07-05 DIAGNOSIS — Z1239 Encounter for other screening for malignant neoplasm of breast: Secondary | ICD-10-CM | POA: Diagnosis not present

## 2015-07-05 DIAGNOSIS — Z0000 Encounter for general adult medical examination without abnormal findings: Secondary | ICD-10-CM | POA: Insufficient documentation

## 2015-07-05 DIAGNOSIS — M5442 Lumbago with sciatica, left side: Secondary | ICD-10-CM

## 2015-07-05 DIAGNOSIS — I10 Essential (primary) hypertension: Secondary | ICD-10-CM

## 2015-07-05 LAB — COMPREHENSIVE METABOLIC PANEL
ALT: 15 U/L (ref 6–29)
AST: 16 U/L (ref 10–35)
Albumin: 4.4 g/dL (ref 3.6–5.1)
Alkaline Phosphatase: 59 U/L (ref 33–130)
BUN: 16 mg/dL (ref 7–25)
CALCIUM: 9.4 mg/dL (ref 8.6–10.4)
CO2: 29 mmol/L (ref 20–31)
Chloride: 101 mmol/L (ref 98–110)
Creat: 0.86 mg/dL (ref 0.50–1.05)
GLUCOSE: 86 mg/dL (ref 65–99)
POTASSIUM: 3.3 mmol/L — AB (ref 3.5–5.3)
Sodium: 143 mmol/L (ref 135–146)
Total Bilirubin: 0.3 mg/dL (ref 0.2–1.2)
Total Protein: 7.3 g/dL (ref 6.1–8.1)

## 2015-07-05 LAB — LIPID PANEL
CHOL/HDL RATIO: 2.6 ratio (ref <=5.0)
CHOLESTEROL: 203 mg/dL — AB (ref 125–200)
HDL: 77 mg/dL (ref >=46)
LDL CALC: 109 mg/dL (ref <130)
TRIGLYCERIDES: 86 mg/dL (ref <150)
VLDL: 17 mg/dL (ref <30)

## 2015-07-05 LAB — CBC
HEMATOCRIT: 35.6 % (ref 35.0–45.0)
Hemoglobin: 11.8 g/dL (ref 11.7–15.5)
MCH: 27.8 pg (ref 27.0–33.0)
MCHC: 33.1 g/dL (ref 32.0–36.0)
MCV: 83.8 fL (ref 80.0–100.0)
MPV: 10.5 fL (ref 7.5–12.5)
PLATELETS: 310 10*3/uL (ref 140–400)
RBC: 4.25 MIL/uL (ref 3.80–5.10)
RDW: 14 % (ref 11.0–15.0)
WBC: 7.4 10*3/uL (ref 3.8–10.8)

## 2015-07-05 LAB — HEMOGLOBIN A1C
Hgb A1c MFr Bld: 5.7 % — ABNORMAL HIGH (ref <5.7)
Mean Plasma Glucose: 117 mg/dL

## 2015-07-05 LAB — TSH: TSH: 1.46 mIU/L

## 2015-07-05 MED ORDER — AMLODIPINE BESYLATE 10 MG PO TABS: 10.0000 mg | ORAL_TABLET | Freq: Every day | ORAL | Status: DC

## 2015-07-05 NOTE — Assessment & Plan Note (Signed)
Now with left lower extremity pain that begins distal to the left hip down to her left knee. Exam today unremarkable. Suspect she's pinching a nerve during sleep that is causing her to wake as this only occurs at night. Low suspicion for restless leg. Offered physical therapy, she will think about. Also mentioned massage therapy.

## 2015-07-05 NOTE — Assessment & Plan Note (Signed)
Td UTD. Pap UTD. Mammogram ordered. Colonoscopy UTD. Labs pending. Exam unremarkable. Discussed the importance of a healthy diet and regular exercise in order for weight loss and to reduce risk of other medical diseases. Commended her on her current weight loss.  Follow up in 1 year for repeat physical.

## 2015-07-05 NOTE — Patient Instructions (Addendum)
Complete lab work prior to leaving today. I will notify you of your results once received.   Continue your efforts towards a healthy lifestyle. Congratulations on your weight loss, keep going!  Ensure you are consuming 64 ounces of water daily.  Follow up in 1 year for repeat physical or sooner if needed.  It was a pleasure to see you today!

## 2015-07-05 NOTE — Assessment & Plan Note (Signed)
Stable on HCTZ and Amlodipine. Continue same. Commended her on her weight loss.

## 2015-07-05 NOTE — Progress Notes (Signed)
Subjective:    Patient ID: Tara Marshall, female    DOB: Jul 05, 1960, 55 y.o.   MRN: [id number]  HPI  Tara Marshall is a 55 year old female who presents today for complete physical.  Immunizations: -Tetanus: Completed in 2012 -Influenza: Did not complete last season   Diet: She endorses a healthy diet. Breakfast: Cereal, oatmeal, cottage cheese, yougurt Lunch: Tuna salad, carrots,  Dinner: Chicken, vegetables, salad, potatoes, rice Snacks: Yogurt, fruit Desserts: Once weekly (cake, muffin) Beverages: Cranberry juice, grapefruit juice, little soda, water, milk  Exercise: She walks 5 days weekly for 1 hour. Eye exam: Due Dental exam: Completes semi-annualy Colonoscopy: Completed 1-2 years ago, normal. Pap Smear: Completed 1 year ago, normal Mammogram: Completed several years ago. Due.  Wt Readings from Last 3 Encounters:  07/05/15 200 lb 12.8 oz (91.082 kg)  02/26/15 202 lb 1.9 oz (91.681 kg)  12/14/14 212 lb 12.8 oz (96.525 kg)      Review of Systems  HENT: Negative for rhinorrhea.   Respiratory: Negative for cough and shortness of breath.   Cardiovascular: Negative for chest pain.  Gastrointestinal: Negative for diarrhea and constipation.  Genitourinary: Negative for difficulty urinating.  Musculoskeletal:       Left upper leg pain, sharp, occurs every morning at 3am. She's taken ibuprofen and OTC leg medication without improvement. Her pain eases up after she repositions herself.   Neurological: Negative for dizziness, numbness and headaches.  Psychiatric/Behavioral:       Denies concerns for anxiety or depression       Past Medical History  Diagnosis Date  . Carotid bruit     right   . Other ill-defined heart disease   . Arthritis   . Chicken pox   . Hypertension   . Migraine      Social History   Social History  . Marital Status: Married    Spouse Name: N/A  . Number of Children: N/A  . Years of Education: N/A   Occupational History  . Not on  file.   Social History Main Topics  . Smoking status: Never Smoker   . Smokeless tobacco: Not on file  . Alcohol Use: 0.6 oz/week    1 Standard drinks or equivalent per week  . Drug Use: No  . Sexual Activity: Not on file   Other Topics Concern  . Not on file   Social History Narrative   Married.   3 children   Highest level of education 12th grade.   Works as a Arts development officer.   Enjoys playing on the computer, walking, organizing.    Past Surgical History  Procedure Laterality Date  . Cesarean section    . Tubal ligation      Family History  Problem Relation Age of Onset  . Heart attack Mother   . Hypertension Mother     No Known Allergies  Current Outpatient Prescriptions on File Prior to Visit  Medication Sig Dispense Refill  . hydrochlorothiazide (HYDRODIURIL) 25 MG tablet Take 1 tablet (25 mg total) by mouth daily. 90 tablet 3  . Iron-Vitamins (GERITOL COMPLETE PO) Take 1 tablet by mouth daily.    . calcium carbonate (OS-CAL) 600 MG TABS Take 600 mg by mouth daily. Reported on 07/05/2015    . meloxicam (MOBIC) 15 MG tablet Take 15 mg by mouth daily. Reported on 07/05/2015     No current facility-administered medications on file prior to visit.    BP 122/80 mmHg  Pulse 67  Temp(Src)  98.2 F (36.8 C) (Oral)  Ht 5\' 4"  (1.626 m)  Wt 200 lb 12.8 oz (91.082 kg)  BMI 34.45 kg/m2  SpO2 97%    Objective:   Physical Exam  Constitutional: She is oriented to person, place, and time. She appears well-nourished.  HENT:  Right Ear: Tympanic membrane and ear canal normal.  Left Ear: Tympanic membrane and ear canal normal.  Nose: Nose normal.  Mouth/Throat: Oropharynx is clear and moist.  Eyes: Conjunctivae and EOM are normal. Pupils are equal, round, and reactive to light.  Neck: Neck supple. No thyromegaly present.  Cardiovascular: Normal rate and regular rhythm.   No murmur heard. Pulmonary/Chest: Effort normal and breath sounds normal. She has no rales.    Abdominal: Soft. Bowel sounds are normal. There is no tenderness.  Musculoskeletal: Normal range of motion.  Non tender, negative straight leg raise.  Lymphadenopathy:    She has no cervical adenopathy.  Neurological: She is alert and oriented to person, place, and time. She has normal reflexes. No cranial nerve deficit.  Skin: Skin is warm and dry. No rash noted.  Psychiatric: She has a normal mood and affect.          Assessment & Plan:

## 2015-07-05 NOTE — Progress Notes (Signed)
Pre visit review using our clinic review tool, if applicable. No additional management support is needed unless otherwise documented below in the visit note. 

## 2015-07-06 LAB — VITAMIN D 25 HYDROXY (VIT D DEFICIENCY, FRACTURES): Vit D, 25-Hydroxy: 22 ng/mL — ABNORMAL LOW (ref 30–100)

## 2015-07-09 ENCOUNTER — Encounter: Payer: Self-pay | Admitting: *Deleted

## 2015-07-20 ENCOUNTER — Other Ambulatory Visit: Payer: Self-pay | Admitting: Primary Care

## 2015-07-23 ENCOUNTER — Telehealth: Payer: Self-pay

## 2015-07-23 NOTE — Telephone Encounter (Signed)
Pt left v/m requesting refill of [facility] to [facility] [address]. Pt did not leave name of med requesting refill for. Left v/m requesting cb.

## 2015-12-05 ENCOUNTER — Other Ambulatory Visit: Payer: Self-pay | Admitting: Primary Care

## 2015-12-05 DIAGNOSIS — Z1231 Encounter for screening mammogram for malignant neoplasm of breast: Secondary | ICD-10-CM

## 2015-12-11 NOTE — Telephone Encounter (Signed)
I spoke with pt and she has already gotten refill and nothing further needed.

## 2016-01-04 ENCOUNTER — Other Ambulatory Visit: Payer: Self-pay | Admitting: Primary Care

## 2016-01-10 ENCOUNTER — Ambulatory Visit
Admission: RE | Admit: 2016-01-10 | Discharge: 2016-01-10 | Disposition: A | Discharge status: Home / Self Care | Payer: Managed Care, Other (non HMO) | Source: Ambulatory Visit | Attending: Primary Care | Admitting: Primary Care

## 2016-01-10 DIAGNOSIS — Z1231 Encounter for screening mammogram for malignant neoplasm of breast: Secondary | ICD-10-CM

## 2016-06-24 ENCOUNTER — Other Ambulatory Visit: Payer: Self-pay | Admitting: Primary Care

## 2016-06-24 DIAGNOSIS — R7303 Prediabetes: Secondary | ICD-10-CM

## 2016-06-24 DIAGNOSIS — I10 Essential (primary) hypertension: Secondary | ICD-10-CM

## 2016-06-24 DIAGNOSIS — E559 Vitamin D deficiency, unspecified: Secondary | ICD-10-CM

## 2016-07-01 ENCOUNTER — Other Ambulatory Visit (INDEPENDENT_AMBULATORY_CARE_PROVIDER_SITE_OTHER): Payer: BLUE CROSS/BLUE SHIELD

## 2016-07-01 DIAGNOSIS — R7303 Prediabetes: Secondary | ICD-10-CM | POA: Diagnosis not present

## 2016-07-01 DIAGNOSIS — I10 Essential (primary) hypertension: Secondary | ICD-10-CM | POA: Diagnosis not present

## 2016-07-01 DIAGNOSIS — E559 Vitamin D deficiency, unspecified: Secondary | ICD-10-CM | POA: Diagnosis not present

## 2016-07-01 LAB — LIPID PANEL
CHOL/HDL RATIO: 3
Cholesterol: 208 mg/dL — ABNORMAL HIGH (ref 0–200)
HDL: 68.7 mg/dL (ref >39.00)
LDL Cholesterol: 126 mg/dL — ABNORMAL HIGH (ref 0–99)
NONHDL: 139.17
Triglycerides: 64 mg/dL (ref 0.0–149.0)
VLDL: 12.8 mg/dL (ref 0.0–40.0)

## 2016-07-01 LAB — COMPREHENSIVE METABOLIC PANEL
ALBUMIN: 4.5 g/dL (ref 3.5–5.2)
ALK PHOS: 64 U/L (ref 39–117)
ALT: 20 U/L (ref 0–35)
AST: 18 U/L (ref 0–37)
BUN: 14 mg/dL (ref 6–23)
CHLORIDE: 100 meq/L (ref 96–112)
CO2: 30 mEq/L (ref 19–32)
Calcium: 10.2 mg/dL (ref 8.4–10.5)
Creatinine, Ser: 0.76 mg/dL (ref 0.40–1.20)
GFR: 100.44 mL/min (ref >60.00)
Glucose, Bld: 76 mg/dL (ref 70–99)
POTASSIUM: 3.2 meq/L — AB (ref 3.5–5.1)
SODIUM: 141 meq/L (ref 135–145)
TOTAL PROTEIN: 7.9 g/dL (ref 6.0–8.3)
Total Bilirubin: 0.6 mg/dL (ref 0.2–1.2)

## 2016-07-01 LAB — HEMOGLOBIN A1C: Hgb A1c MFr Bld: 6 % (ref 4.6–6.5)

## 2016-07-01 LAB — VITAMIN D 25 HYDROXY (VIT D DEFICIENCY, FRACTURES): VITD: 25.57 ng/mL — ABNORMAL LOW (ref 30.00–100.00)

## 2016-07-02 ENCOUNTER — Other Ambulatory Visit: Payer: Self-pay | Admitting: Primary Care

## 2016-07-02 DIAGNOSIS — E876 Hypokalemia: Secondary | ICD-10-CM

## 2016-07-02 MED ORDER — POTASSIUM CHLORIDE ER 20 MEQ PO TBCR: EXTENDED_RELEASE_TABLET | ORAL | 0 refills | Status: DC

## 2016-07-07 ENCOUNTER — Encounter: Payer: Self-pay | Admitting: Primary Care

## 2016-07-07 ENCOUNTER — Ambulatory Visit (INDEPENDENT_AMBULATORY_CARE_PROVIDER_SITE_OTHER): Payer: BLUE CROSS/BLUE SHIELD | Admitting: Primary Care

## 2016-07-07 VITALS — BP 118/72 | HR 71 | Temp 97.4°F | Ht 64.0 in | Wt 205.8 lb

## 2016-07-07 DIAGNOSIS — E785 Hyperlipidemia, unspecified: Secondary | ICD-10-CM | POA: Diagnosis not present

## 2016-07-07 DIAGNOSIS — Z0000 Encounter for general adult medical examination without abnormal findings: Secondary | ICD-10-CM

## 2016-07-07 DIAGNOSIS — R7303 Prediabetes: Secondary | ICD-10-CM | POA: Insufficient documentation

## 2016-07-07 DIAGNOSIS — I10 Essential (primary) hypertension: Secondary | ICD-10-CM

## 2016-07-07 DIAGNOSIS — R300 Dysuria: Secondary | ICD-10-CM | POA: Diagnosis not present

## 2016-07-07 DIAGNOSIS — E876 Hypokalemia: Secondary | ICD-10-CM | POA: Diagnosis not present

## 2016-07-07 LAB — POC URINALSYSI DIPSTICK (AUTOMATED)
BILIRUBIN UA: NEGATIVE
Glucose, UA: NEGATIVE
KETONES UA: NEGATIVE
Leukocytes, UA: NEGATIVE
Nitrite, UA: NEGATIVE
Protein, UA: NEGATIVE
RBC UA: NEGATIVE
SPEC GRAV UA: 1.025 (ref 1.010–1.025)
Urobilinogen, UA: 0.2 E.U./dL
pH, UA: 5.5 (ref 5.0–8.0)

## 2016-07-07 LAB — POTASSIUM: Potassium: 3.7 mEq/L (ref 3.5–5.1)

## 2016-07-07 NOTE — Patient Instructions (Addendum)
Stop the hydrochlorothiazide 25 mg tablets for high blood pressure. Continue Amlodipine 10 mg for high blood pressure.  Monitor your blood pressure for the next 2 weeks, I'll call for your readings at that time. Please call me sooner if you start to see readings above 135/90.  Complete lab work prior to leaving today. I will notify you of your results once received.   Schedule a lab only appointment in 6 months to recheck your A1C.   Start taking Vitamin D as discussed.   Continue exercising. You should be getting 150 minutes of moderate intensity exercise weekly.  It's important to improve your diet by reducing consumption of fried food, processed snack foods, sugary drinks. Increase consumption of fresh vegetables and fruits, whole grains, water.  Ensure you are drinking 64 ounces of water daily.  It was a pleasure to see you today!

## 2016-07-07 NOTE — Assessment & Plan Note (Signed)
TC slightly above goal, LDL borderline. Discussed the importance of a healthy diet and regular exercise in order for weight loss, and to reduce the risk of hyperlipidemia.

## 2016-07-07 NOTE — Assessment & Plan Note (Signed)
Suspect HCTZ causing hypokalemia. Will have her stop HCTZ, continue Amlodipine 10 mg. She will monitor BP at home over next 2 weeks, will call for readings at that time.

## 2016-07-07 NOTE — Assessment & Plan Note (Signed)
A1C of 6.0 on recent labs. Will have her work on weight loss through diet and exercise. Recheck in 6 months.

## 2016-07-07 NOTE — Progress Notes (Signed)
Subjective:    Patient ID: Tara Marshall, female    DOB: 30-Mar-1960, 56 y.o.   MRN: [id number]  HPI  Tara Marshall is a 56 year old female who presents today for complete physical.   Immunizations: -Tetanus: Completed in 2012 -Influenza: Did not complete last season   Diet: She endorses a fair diet. Breakfast: Cereal, oatmeal Lunch: Sandwich, salad Dinner: Kale, spinach, rice, chicken, sandwich Snacks: Fruit, cereal bars, yogurt Desserts: Occasionally  Beverages: Orange juice, water, occasional green tree  Exercise: Walking 5 days weekly, 1-2 hours. Eye exam: Completed in May 2018 Dental exam: Has not completed recently Colonoscopy: Completed in 2016. Pap Smear: Completed over three years ago Mammogram: Completed in December 2017.  Wt Readings from Last 3 Encounters:  07/07/16 205 lb 12.8 oz (93.4 kg)  07/05/15 200 lb 12.8 oz (91.1 kg)  02/26/15 202 lb 1.9 oz (91.7 kg)      Review of Systems  Constitutional: Negative for unexpected weight change.  HENT: Negative for rhinorrhea.   Respiratory: Negative for cough and shortness of breath.   Cardiovascular: Negative for chest pain.  Gastrointestinal: Negative for constipation and diarrhea.  Genitourinary: Positive for dysuria and frequency. Negative for difficulty urinating and menstrual problem.  Musculoskeletal: Negative for arthralgias and myalgias.  Skin: Negative for rash.  Allergic/Immunologic: Negative for environmental allergies.  Neurological: Negative for dizziness, numbness and headaches.  Psychiatric/Behavioral:       Denies concerns for anxiety, occasional depression. Overall manages well.       Past Medical History:  Diagnosis Date  . Arthritis   . Carotid bruit    right   . Chicken pox   . Hypertension   . Migraine   . Other ill-defined heart disease      Social History   Social History  . Marital status: Married    Spouse name: N/A  . Number of children: N/A  . Years of education: N/A     Occupational History  . Not on file.   Social History Main Topics  . Smoking status: Never Smoker  . Smokeless tobacco: Never Used  . Alcohol use 0.6 oz/week    1 Standard drinks or equivalent per week  . Drug use: No  . Sexual activity: Not on file   Other Topics Concern  . Not on file   Social History Narrative   Married.   3 children   Highest level of education 12th grade.   Works as a Arts development officer.   Enjoys playing on the computer, walking, organizing.    Past Surgical History:  Procedure Laterality Date  . BREAST CYST ASPIRATION     at age 32, possibly left breast  . CESAREAN SECTION    . TUBAL LIGATION      Family History  Problem Relation Age of Onset  . Heart attack Mother   . Hypertension Mother     No Known Allergies  Current Outpatient Prescriptions on File Prior to Visit  Medication Sig Dispense Refill  . amLODipine (NORVASC) 10 MG tablet Take 1 tablet (10 mg total) by mouth daily. 90 tablet 2  . calcium carbonate (OS-CAL) 600 MG TABS Take 600 mg by mouth daily. Reported on 07/05/2015    . Choline & Mag Salicylates 1000 MG TABS Take 1 tablet by mouth daily.    . hydrochlorothiazide (HYDRODIURIL) 25 MG tablet TAKE ONE TABLET BY MOUTH ONCE DAILY 90 tablet 1  . Iron-Vitamins (GERITOL COMPLETE PO) Take 1 tablet by mouth daily.    Marland Kitchen  meloxicam (MOBIC) 15 MG tablet Take 15 mg by mouth daily. Reported on 07/05/2015     No current facility-administered medications on file prior to visit.     BP 118/72   Pulse 71   Temp 97.4 F (36.3 C) (Oral)   Ht 5\' 4"  (1.626 m)   Wt 205 lb 12.8 oz (93.4 kg)   SpO2 99%   BMI 35.33 kg/m    Objective:   Physical Exam  Constitutional: She is oriented to person, place, and time. She appears well-nourished.  HENT:  Right Ear: Tympanic membrane and ear canal normal.  Left Ear: Tympanic membrane and ear canal normal.  Nose: Nose normal.  Mouth/Throat: Oropharynx is clear and moist.  Eyes: Conjunctivae and EOM are  normal. Pupils are equal, round, and reactive to light.  Neck: Neck supple. No thyromegaly present.  Cardiovascular: Normal rate and regular rhythm.   No murmur heard. Pulmonary/Chest: Effort normal and breath sounds normal. She has no rales.  Abdominal: Soft. Bowel sounds are normal. There is no tenderness. There is no CVA tenderness.  Musculoskeletal: Normal range of motion.  Lymphadenopathy:    She has no cervical adenopathy.  Neurological: She is alert and oriented to person, place, and time. She has normal reflexes. No cranial nerve deficit.  Skin: Skin is warm and dry. No rash noted.  Psychiatric: She has a normal mood and affect.          Assessment & Plan:

## 2016-07-07 NOTE — Assessment & Plan Note (Addendum)
Immunizations UTD. Pap due, she declines today. Colonoscopy UTD. Mammogram UTD, due in December 2018. Discussed the importance of a healthy diet and regular exercise in order for weight loss, and to reduce the risk of other medical diseases. Exam unremarkable. Labs with prediabetes and hypokalemia which were addressed. UA negative. Follow up in 1 year for annual exam.

## 2016-07-22 ENCOUNTER — Telehealth: Payer: Self-pay | Admitting: Primary Care

## 2016-07-22 DIAGNOSIS — I10 Essential (primary) hypertension: Secondary | ICD-10-CM

## 2016-07-22 NOTE — Telephone Encounter (Signed)
Please check on BP since we stopped HCTZ and switched to Amlodipine 10 mg. How's she doing?

## 2016-07-22 NOTE — Telephone Encounter (Signed)
Message left for patient to return my call.  

## 2016-07-24 NOTE — Telephone Encounter (Signed)
Message left for patient to return my call.  

## 2016-07-27 NOTE — Telephone Encounter (Signed)
Spoken to patient she stated that she went back and started to take the hydrochlorothiazide when she noticed that she was more bloated. Patient stopped taking the amlodipine and just taking only the hydrochlorothiazide.  Patient stated that the last time she check her blood pressure was Friday, it was 118/72.

## 2016-07-28 ENCOUNTER — Other Ambulatory Visit: Payer: Self-pay | Admitting: Primary Care

## 2016-07-28 DIAGNOSIS — E876 Hypokalemia: Secondary | ICD-10-CM

## 2016-07-28 NOTE — Telephone Encounter (Signed)
Message left for patient to return my call.  

## 2016-07-28 NOTE — Telephone Encounter (Signed)
The HCTZ was dropping her potassium, so if she's going to resume the HCTZ then she'll likely need to be on a potassium supplement. Let's recheck her potassium in 1 week. Please arrange a lab only appointment.

## 2016-07-29 NOTE — Telephone Encounter (Signed)
Spoken and notified patient of Kate's comments. Patient verbalized understanding.  Lab appt on 08/03/2016

## 2016-08-03 ENCOUNTER — Other Ambulatory Visit (INDEPENDENT_AMBULATORY_CARE_PROVIDER_SITE_OTHER): Payer: BLUE CROSS/BLUE SHIELD

## 2016-08-03 DIAGNOSIS — E876 Hypokalemia: Secondary | ICD-10-CM

## 2016-08-03 LAB — POTASSIUM: Potassium: 3.3 mEq/L — ABNORMAL LOW (ref 3.5–5.1)

## 2016-08-04 ENCOUNTER — Other Ambulatory Visit: Payer: Self-pay | Admitting: Primary Care

## 2016-08-04 DIAGNOSIS — E876 Hypokalemia: Secondary | ICD-10-CM

## 2016-08-04 MED ORDER — POTASSIUM CHLORIDE CRYS ER 10 MEQ PO TBCR: 10.0000 meq | EXTENDED_RELEASE_TABLET | Freq: Every day | ORAL | 0 refills | Status: DC

## 2016-08-08 ENCOUNTER — Other Ambulatory Visit: Payer: Self-pay | Admitting: Primary Care

## 2016-08-08 DIAGNOSIS — I10 Essential (primary) hypertension: Secondary | ICD-10-CM

## 2016-08-15 ENCOUNTER — Other Ambulatory Visit: Payer: Self-pay | Admitting: Primary Care

## 2016-08-15 DIAGNOSIS — I10 Essential (primary) hypertension: Secondary | ICD-10-CM

## 2016-08-17 ENCOUNTER — Other Ambulatory Visit: Payer: Self-pay | Admitting: Primary Care

## 2016-08-17 DIAGNOSIS — I10 Essential (primary) hypertension: Secondary | ICD-10-CM

## 2016-08-17 MED ORDER — AMLODIPINE BESYLATE 10 MG PO TABS
10.0000 mg | ORAL_TABLET | Freq: Every day | ORAL | 2 refills | Status: DC
Start: 2016-08-17 — End: 2018-04-15

## 2016-08-17 NOTE — Addendum Note (Signed)
Addended by: Doreene NestLARK, Jamaria Amborn K on: 08/17/2016 04:53 PM   Modules accepted: Orders

## 2016-08-17 NOTE — Telephone Encounter (Signed)
Refilled Amlodipine.

## 2016-08-17 NOTE — Telephone Encounter (Signed)
Patient stated that she has been taking both medication. Her blood pressure has been good, it has been running around 115's over 70's.

## 2016-08-17 NOTE — Telephone Encounter (Signed)
Pt request cb about Amlodipine refill ASAP.

## 2016-08-17 NOTE — Addendum Note (Signed)
Addended by: Tawnya CrookSAMBATH, Timofey Carandang on: 08/17/2016 12:21 PM   Modules accepted: Orders

## 2016-08-17 NOTE — Telephone Encounter (Signed)
Pt left /vm requesting cb; pt is out of amlodipine and wants to know why amlodipine has not been refilled to [facility] [address].Please advise.

## 2016-08-17 NOTE — Addendum Note (Signed)
Addended by: Patience MuscaISLEY, Gayna Braddy M on: 08/17/2016 04:10 PM   Modules accepted: Orders

## 2016-08-24 ENCOUNTER — Telehealth: Payer: Self-pay

## 2016-08-24 NOTE — Telephone Encounter (Signed)
Pt called to ck on refill for amlodipine 10 mg. [facility] [address] [facility] rx has been ready for 8 days at cost of $2.30. Pt voiced understanding and will pick up med.

## 2016-08-28 ENCOUNTER — Other Ambulatory Visit (INDEPENDENT_AMBULATORY_CARE_PROVIDER_SITE_OTHER): Payer: BLUE CROSS/BLUE SHIELD

## 2016-08-28 DIAGNOSIS — E876 Hypokalemia: Secondary | ICD-10-CM | POA: Diagnosis not present

## 2016-08-28 LAB — POTASSIUM: Potassium: 3.1 mEq/L — ABNORMAL LOW (ref 3.5–5.1)

## 2016-09-02 ENCOUNTER — Telehealth: Payer: Self-pay

## 2016-09-02 NOTE — Telephone Encounter (Signed)
Pt left v/m; pt has started taking the Potassium tabs and pt is getting cramps moving all around pts body like pulled muscles. Pt wants to know if this is normal taking Potassium or what should pt do.Please advise.

## 2016-09-02 NOTE — Telephone Encounter (Signed)
Her potassium is too low, she needs potassium replacement or she needs to STOP the hydrochlorothiazide which is causing the low potassium. We will need to switch her from HCTZ to another medication, I recommend Lisinopril or Losartan.

## 2016-09-03 NOTE — Telephone Encounter (Signed)
Please have her monitor her BP while on Amlodipine 10 mg and off of HCTZ. Would like to see her in the office in 1 week for recheck. We will discuss the next step during that visit.

## 2016-09-03 NOTE — Telephone Encounter (Signed)
Spoken and notified patient of Kate's comments. Patient stated that she stop the HCTZ for a week now. She is taking the potassium tablets since recent lab was lower.   Patient stated if she can something similar to HCTZ

## 2016-09-04 NOTE — Telephone Encounter (Signed)
Follow up on 09/11/2016

## 2016-09-11 ENCOUNTER — Ambulatory Visit (INDEPENDENT_AMBULATORY_CARE_PROVIDER_SITE_OTHER): Payer: BLUE CROSS/BLUE SHIELD | Admitting: Primary Care

## 2016-09-11 VITALS — BP 120/70 | HR 82 | Ht 64.0 in | Wt 207.0 lb

## 2016-09-11 DIAGNOSIS — E876 Hypokalemia: Secondary | ICD-10-CM | POA: Diagnosis not present

## 2016-09-11 DIAGNOSIS — I10 Essential (primary) hypertension: Secondary | ICD-10-CM | POA: Diagnosis not present

## 2016-09-11 LAB — POTASSIUM: Potassium: 3.7 mEq/L (ref 3.5–5.1)

## 2016-09-11 NOTE — Progress Notes (Signed)
Subjective:    Patient ID: Tara Marshall, female    DOB: 09/20/1960, 56 y.o.   MRN: [id number]  HPI  Ms. Tara Marshall is a 56 year old female who presents today for follow up of hypertension. Currently managed on amlodipine 10 mg. She was also managed on HCTZ but this was discontinued due to persistent drops in potassium. She's not taken her HCTZ in 1 week.   She's checking her BP at stores which is running 120's/70's. She's been taking her potassium 10 mEq daily for the past 1 week. She has noticed symptoms of abdominal bloating. She's taking gummy probiotics without much improvement.   BP Readings from Last 3 Encounters:  09/11/16 120/70  07/07/16 118/72  07/05/15 122/80     Review of Systems  Eyes: Negative for visual disturbance.  Respiratory: Negative for shortness of breath.   Cardiovascular: Negative for chest pain.  Neurological: Negative for dizziness and headaches.       Past Medical History:  Diagnosis Date  . Arthritis   . Carotid bruit    right   . Chicken pox   . Hypertension   . Migraine   . Other ill-defined heart disease      Social History   Social History  . Marital status: Married    Spouse name: N/A  . Number of children: N/A  . Years of education: N/A   Occupational History  . Not on file.   Social History Main Topics  . Smoking status: Never Smoker  . Smokeless tobacco: Never Used  . Alcohol use 0.6 oz/week    1 Standard drinks or equivalent per week  . Drug use: No  . Sexual activity: Not on file   Other Topics Concern  . Not on file   Social History Narrative   Married.   3 children   Highest level of education 12th grade.   Works as a Arts development officerhome maker.   Enjoys playing on the computer, walking, organizing.    Past Surgical History:  Procedure Laterality Date  . BREAST CYST ASPIRATION     at age 56, possibly left breast  . CESAREAN SECTION    . TUBAL LIGATION      Family History  Problem Relation Age of Onset  . Heart attack  Mother   . Hypertension Mother     No Known Allergies  Current Outpatient Prescriptions on File Prior to Visit  Medication Sig Dispense Refill  . calcium carbonate (OS-CAL) 600 MG TABS Take 600 mg by mouth daily. Reported on 07/05/2015    . Choline & Mag Salicylates 1000 MG TABS Take 1 tablet by mouth daily.    . Iron-Vitamins (GERITOL COMPLETE PO) Take 1 tablet by mouth daily.    . meloxicam (MOBIC) 15 MG tablet Take 15 mg by mouth daily. Reported on 07/05/2015    . potassium chloride (K-DUR,KLOR-CON) 10 MEQ tablet Take 1 tablet (10 mEq total) by mouth daily. 90 tablet 0  . amLODipine (NORVASC) 10 MG tablet Take 1 tablet (10 mg total) by mouth daily. 90 tablet 2   No current facility-administered medications on file prior to visit.     BP 120/70   Pulse 82   Ht 5\' 4"  (1.626 m)   Wt 207 lb (93.9 kg)   SpO2 98%   BMI 35.53 kg/m    Objective:   Physical Exam  Constitutional: She appears well-nourished.  Neck: Neck supple.  Cardiovascular: Normal rate and regular rhythm.   Pulmonary/Chest: Effort normal  and breath sounds normal.  Skin: Skin is warm and dry.          Assessment & Plan:

## 2016-09-11 NOTE — Patient Instructions (Signed)
Complete lab work prior to leaving today. I will notify you of your results once received.   Continue Amlodipine 10 mg tablets.  Continue to monitor your blood pressure and report readings consistently above 135/90.  Schedule a lab only appointment in 3 weeks to recheck potassium levels.  It was a pleasure to see you today!

## 2016-09-11 NOTE — Assessment & Plan Note (Signed)
Stable on amlodipine 10 mg alone, continue this for now. She will monitor home readings and report anything above 135/90 on a consistent basis. Potassium level pending today.

## 2016-12-11 ENCOUNTER — Other Ambulatory Visit: Payer: BLUE CROSS/BLUE SHIELD

## 2016-12-16 ENCOUNTER — Other Ambulatory Visit: Payer: Self-pay | Admitting: Primary Care

## 2016-12-16 DIAGNOSIS — R7303 Prediabetes: Secondary | ICD-10-CM

## 2016-12-16 DIAGNOSIS — E876 Hypokalemia: Secondary | ICD-10-CM

## 2016-12-25 ENCOUNTER — Other Ambulatory Visit: Payer: BLUE CROSS/BLUE SHIELD

## 2016-12-28 ENCOUNTER — Other Ambulatory Visit (INDEPENDENT_AMBULATORY_CARE_PROVIDER_SITE_OTHER): Payer: BLUE CROSS/BLUE SHIELD

## 2016-12-28 DIAGNOSIS — E876 Hypokalemia: Secondary | ICD-10-CM | POA: Diagnosis not present

## 2016-12-28 DIAGNOSIS — R7303 Prediabetes: Secondary | ICD-10-CM | POA: Diagnosis not present

## 2016-12-28 LAB — POTASSIUM: POTASSIUM: 3.6 meq/L (ref 3.5–5.1)

## 2016-12-28 LAB — HEMOGLOBIN A1C: HEMOGLOBIN A1C: 5.8 % (ref 4.6–6.5)

## 2017-01-08 ENCOUNTER — Other Ambulatory Visit: Payer: BLUE CROSS/BLUE SHIELD

## 2017-04-09 ENCOUNTER — Ambulatory Visit: Payer: Self-pay

## 2017-04-09 NOTE — Telephone Encounter (Signed)
Pt. Reports she has had burning with voiding for "about a month, but it is getting worse." Saw "some blood when wiping." Offered appointment today, but reports she can't come in until Monday 04/12/17. Appointment made. Reason for Disposition . Age > 50 years  Answer Assessment - Initial Assessment Questions 1. SEVERITY: "How bad is the pain?"  (e.g., Scale 1-10; mild, moderate, or severe)   - MILD (1-3): complains slightly about urination hurting   - MODERATE (4-7): interferes with normal activities     - SEVERE (8-10): excruciating, unwilling or unable to urinate because of the pain      3-4 2. FREQUENCY: "How many times have you had painful urination today?"      Has frequency 3. PATTERN: "Is pain present every time you urinate or just sometimes?"      Pain every time now 4. ONSET: "When did the painful urination start?"      Last month - off and on 5. FEVER: "Do you have a fever?" If so, ask: "What is your temperature, how was it measured, and when did it start?"     No 6. PAST UTI: "Have you had a urine infection before?" If so, ask: "When was the last time?" and "What happened that time?"      Yes 7. CAUSE: "What do you think is causing the painful urination?"  (e.g., UTI, scratch, Herpes sore)     UTI 8. OTHER SYMPTOMS: "Do you have any other symptoms?" (e.g., flank pain, vaginal discharge, genital sores, urgency, blood in urine)     Blood after wiping 9. PREGNANCY: "Is there any chance you are pregnant?" "When was your last menstrual period?"     No  Protocols used: URINATION PAIN - FEMALE-A-AH

## 2017-04-12 ENCOUNTER — Encounter: Payer: Self-pay | Admitting: Family Medicine

## 2017-04-12 ENCOUNTER — Ambulatory Visit (INDEPENDENT_AMBULATORY_CARE_PROVIDER_SITE_OTHER): Payer: BLUE CROSS/BLUE SHIELD | Admitting: Family Medicine

## 2017-04-12 VITALS — BP 138/74 | HR 73 | Temp 98.3°F | Wt 210.5 lb

## 2017-04-12 DIAGNOSIS — K30 Functional dyspepsia: Secondary | ICD-10-CM

## 2017-04-12 DIAGNOSIS — R300 Dysuria: Secondary | ICD-10-CM

## 2017-04-12 LAB — POC URINALSYSI DIPSTICK (AUTOMATED)
Bilirubin, UA: NEGATIVE
Glucose, UA: NEGATIVE
Leukocytes, UA: NEGATIVE
NITRITE UA: NEGATIVE
PH UA: 6 (ref 5.0–8.0)
Protein, UA: NEGATIVE
RBC UA: NEGATIVE
Spec Grav, UA: 1.02 (ref 1.010–1.025)
UROBILINOGEN UA: 0.2 U/dL

## 2017-04-12 NOTE — Patient Instructions (Signed)
Good to see you today  Drink only water for 5-7 days, drink enough water to make your urine light yellow  I will notify you of urine culture results

## 2017-04-12 NOTE — Progress Notes (Signed)
Subjective:    Patient ID: Tara Marshall, female    DOB: 01/28/1961, 57 y.o.   MRN: [id number]  HPI This is a 57 yo female who presents today with dysuria. Has been intermittent, maybe once a day or every couple of days. Burning with urination and lower abdominal pressure. No back pain. Occasional diarrhea. Hot at night. Dry mouth. Drinking 4-6 glasses of water a day. Drinks a small soda daily,  Powdered drink mixes, rare juice, some cranberry.  Occasional light brown vaginal discharge. No itching or burning. Sexually active, no dyspareunia.   Occasional headaches. Takes acetaminophen and sinus medication with relief. Thinks related to changing weather.   Increased acid reflux since she has gone off diet which previously controlled symptoms well. She reports it is difficult to eat healthy because she is cooking for so many different tastes- husband and one son won't eat her "healthy" food.   Past Medical History:  Diagnosis Date  . Arthritis   . Carotid bruit    right   . Chicken pox   . Hypertension   . Migraine   . Other ill-defined heart disease    Past Surgical History:  Procedure Laterality Date  . BREAST CYST ASPIRATION     at age 57, possibly left breast  . CESAREAN SECTION    . TUBAL LIGATION     Family History  Problem Relation Age of Onset  . Heart attack Mother   . Hypertension Mother    Social History   Tobacco Use  . Smoking status: Never Smoker  . Smokeless tobacco: Never Used  Substance Use Topics  . Alcohol use: Yes    Alcohol/week: 0.6 oz    Types: 1 Standard drinks or equivalent per week  . Drug use: No      Review of Systems Per HPI    Objective:   Physical Exam Physical Exam  Constitutional: She is oriented to person, place, and time. She appears well-developed and well-nourished. No distress.  HENT:  Head: Normocephalic and atraumatic.  Cardiovascular: Normal rate, regular rhythm and normal heart sounds.   Pulmonary/Chest: Effort normal  and breath sounds normal.  Abdominal: Soft. She exhibits no distension. There is no tenderness. There is no rebound, no guarding and no CVA tenderness.  Neurological: She is alert and oriented to person, place, and time.  Skin: Skin is warm and dry. She is not diaphoretic.  Psychiatric: She has a normal mood and affect. Her behavior is normal. Judgment and thought content normal.  Vitals reviewed.   BP 138/74   Pulse 73   Temp 98.3 F (36.8 C) (Oral)   Wt 210 lb 8 oz (95.5 kg)   SpO2 99%   BMI 36.13 kg/m  Wt Readings from Last 3 Encounters:  04/12/17 210 lb 8 oz (95.5 kg)  09/11/16 207 lb (93.9 kg)  07/07/16 205 lb 12.8 oz (93.4 kg)   Results for orders placed or performed in visit on 04/12/17  POCT Urinalysis Dipstick (Automated)  Result Value Ref Range   Color, UA yellow    Clarity, UA clear    Glucose, UA neg    Bilirubin, UA neg    Ketones, UA ne    Spec Grav, UA 1.020 1.010 - 1.025   Blood, UA neg    pH, UA 6.0 5.0 - 8.0   Protein, UA neg    Urobilinogen, UA 0.2 0.2 or 1.0 E.U./dL   Nitrite, UA neg    Leukocytes, UA Negative Negative  Assessment & Plan:  1. Dysuria - negative urinalysis, will send for culture given persistent, intermittent symptoms - encouraged her to drink only water for 5-7 days to see if related to soda/artificial sweetener she is having - POCT Urinalysis Dipstick (Automated) - Urine Culture  2. Acid indigestion - Was doing well when watching diet, encouraged her to avoid triggers and work on healthy food choices.    Olean Ree, FNP-BC  [facility], [facility]  04/12/2017 10:08 AM

## 2017-04-13 LAB — URINE CULTURE
MICRO NUMBER: 90275417
Result:: NO GROWTH
SPECIMEN QUALITY: ADEQUATE

## 2017-09-09 ENCOUNTER — Encounter: Payer: Self-pay | Admitting: Emergency Medicine

## 2017-09-09 ENCOUNTER — Emergency Department: Payer: BLUE CROSS/BLUE SHIELD

## 2017-09-09 ENCOUNTER — Other Ambulatory Visit: Payer: Self-pay

## 2017-09-09 ENCOUNTER — Emergency Department
Admission: EM | Admit: 2017-09-09 | Discharge: 2017-09-09 | Disposition: A | Discharge status: Home / Self Care | Payer: BLUE CROSS/BLUE SHIELD | Attending: Emergency Medicine | Admitting: Emergency Medicine

## 2017-09-09 DIAGNOSIS — M549 Dorsalgia, unspecified: Secondary | ICD-10-CM | POA: Insufficient documentation

## 2017-09-09 DIAGNOSIS — Z79899 Other long term (current) drug therapy: Secondary | ICD-10-CM | POA: Diagnosis not present

## 2017-09-09 DIAGNOSIS — R350 Frequency of micturition: Secondary | ICD-10-CM | POA: Insufficient documentation

## 2017-09-09 DIAGNOSIS — R1084 Generalized abdominal pain: Secondary | ICD-10-CM | POA: Diagnosis not present

## 2017-09-09 DIAGNOSIS — R109 Unspecified abdominal pain: Secondary | ICD-10-CM

## 2017-09-09 DIAGNOSIS — R110 Nausea: Secondary | ICD-10-CM | POA: Insufficient documentation

## 2017-09-09 DIAGNOSIS — I10 Essential (primary) hypertension: Secondary | ICD-10-CM | POA: Diagnosis not present

## 2017-09-09 LAB — CBC WITH DIFFERENTIAL/PLATELET
Basophils Absolute: 0.1 10*3/uL (ref 0–0.1)
Basophils Relative: 1 %
Eosinophils Absolute: 0 10*3/uL (ref 0–0.7)
Eosinophils Relative: 0 %
HEMATOCRIT: 37.9 % (ref 35.0–47.0)
HEMOGLOBIN: 12.5 g/dL (ref 12.0–16.0)
Lymphocytes Relative: 11 %
Lymphs Abs: 1.2 10*3/uL (ref 1.0–3.6)
MCH: 28.4 pg (ref 26.0–34.0)
MCHC: 33.1 g/dL (ref 32.0–36.0)
MCV: 85.8 fL (ref 80.0–100.0)
MONO ABS: 0.3 10*3/uL (ref 0.2–0.9)
MONOS PCT: 3 %
NEUTROS ABS: 9.5 10*3/uL — AB (ref 1.4–6.5)
NEUTROS PCT: 85 %
Platelets: 251 10*3/uL (ref 150–440)
RBC: 4.42 MIL/uL (ref 3.80–5.20)
RDW: 13.8 % (ref 11.5–14.5)
WBC: 11.1 10*3/uL — ABNORMAL HIGH (ref 3.6–11.0)

## 2017-09-09 LAB — BASIC METABOLIC PANEL
Anion gap: 8 (ref 5–15)
BUN: 13 mg/dL (ref 6–20)
CALCIUM: 9.6 mg/dL (ref 8.9–10.3)
CHLORIDE: 106 mmol/L (ref 98–111)
CO2: 27 mmol/L (ref 22–32)
Creatinine, Ser: 0.81 mg/dL (ref 0.44–1.00)
GFR calc Af Amer: >60 mL/min (ref >60)
Glucose, Bld: 112 mg/dL — ABNORMAL HIGH (ref 70–99)
Potassium: 3.5 mmol/L (ref 3.5–5.1)
Sodium: 141 mmol/L (ref 135–145)

## 2017-09-09 LAB — URINALYSIS, ROUTINE W REFLEX MICROSCOPIC
BILIRUBIN URINE: NEGATIVE
Bacteria, UA: NONE SEEN
GLUCOSE, UA: NEGATIVE mg/dL
Hgb urine dipstick: NEGATIVE
Ketones, ur: NEGATIVE mg/dL
LEUKOCYTES UA: NEGATIVE
Nitrite: NEGATIVE
Protein, ur: 30 mg/dL — AB
SPECIFIC GRAVITY, URINE: 1.019 (ref 1.005–1.030)
pH: 8 (ref 5.0–8.0)

## 2017-09-09 MED ORDER — DICLOFENAC SODIUM 1 % TD GEL: TRANSDERMAL | 0 refills | Status: DC

## 2017-09-09 MED ORDER — SODIUM CHLORIDE 0.9 % IV BOLUS
1000.0000 mL | Freq: Once | INTRAVENOUS | Status: AC
  Administered 2017-09-09: 1000 mL via INTRAVENOUS

## 2017-09-09 MED ORDER — KETOROLAC TROMETHAMINE 30 MG/ML IJ SOLN
10.0000 mg | Freq: Once | INTRAMUSCULAR | Status: AC
  Administered 2017-09-09: 9.9 mg via INTRAVENOUS
  Filled 2017-09-09: qty 1

## 2017-09-09 MED ORDER — ONDANSETRON HCL 4 MG/2ML IJ SOLN
4.0000 mg | Freq: Once | INTRAMUSCULAR | Status: AC
  Administered 2017-09-09: 4 mg via INTRAVENOUS
  Filled 2017-09-09: qty 2

## 2017-09-09 MED ORDER — CARISOPRODOL 350 MG PO TABS: 350.0000 mg | ORAL_TABLET | Freq: Three times a day (TID) | ORAL | 0 refills | Status: DC | PRN

## 2017-09-09 NOTE — ED Provider Notes (Signed)
Signout from Dr. Dolores FrameSung in this 57 year old female who is presenting with right-sided back pain.  Concern for kidney stone.  Pending CT of the abdomen and pelvis as well as chest x-ray.  Given IV Toradol.  Physical Exam  BP 132/72   Pulse 71   Temp 98.1 F (36.7 C) (Oral)   Resp 17   Ht 5\' 6"  (1.676 m)   Wt 90.3 kg (199 lb)   SpO2 100%   BMI 32.12 kg/m  ----------------------------------------- 8:15 AM on 09/09/2017 -----------------------------------------   Physical Exam Patient still very tender to the left flank. ED Course/Procedures   Clinical Course as of Sep 09 812  Thu Sep 09, 2017  [id number] Care transferred to Dr. Pershing ProudSchaevitz pending results of lab work, CT scan and disposition.   [JS]    Clinical Course User Index [JS] Irean HongSung, Jade J, MD    Procedures  MDM  No acute finding on the CT of the abdomen and pelvis nor the chest x-ray.  Patient states that she was cleaning a bathtub and then went on a walk yesterday and the pain started as a pulling but has worsened.  She says that it is also been associated with nausea vomiting diarrhea.  Slightly elevated white blood cell count.  No obvious source of infection as far as the urine is concerned or are from the CAT scan.  Possible viral syndrome versus muscle strain.  Will be discharged with diclofenac as well as Soma.  Patient understanding of the diagnosis as well as treatment plan and willing to comply.      Myrna BlazerSchaevitz, Caylea Foronda Matthew, MD 09/09/17 [phone]

## 2017-09-09 NOTE — ED Notes (Signed)
Right flank pain since yesterday morning. Increasing throughout the day

## 2017-09-09 NOTE — ED Notes (Signed)
NAD noted at time of D/C. Pt denies questions or concerns. Pt ambulatory to the lobby at this time.  

## 2017-09-09 NOTE — ED Triage Notes (Signed)
Patient to ER for c/o right mid back/flank pain since yesterday at approx 0900. Patient reports she has been urinating more than usual, but denies any dysuria or fevers.

## 2017-09-09 NOTE — ED Notes (Signed)
This RN to bedside at this time, introduced self to patient. Pt just returned from CT. Explained waiting for CT results. Pt states understanding. Denies any needs at this time. Will continue to monitor for further patient needs.

## 2017-09-09 NOTE — ED Provider Notes (Signed)
[facility] Emergency Department Provider Note   ____________________________________________   First MD Initiated Contact with Patient 09/09/17 0600     (approximate)  I have reviewed the triage vital signs and the nursing notes.   HISTORY  Chief Complaint Back Pain    HPI SHIKITA VAILLANCOURT is a 57 y.o. female who presents to the ED from home with a chief complaint of right flank pain.  Patient reports walking at approximately 9 AM yesterday.  Throughout the day she noted aching type pain to her left flank which worsened with movements.  Also states she is urinating more than usual.  Symptoms associated with nausea, no vomiting.  Denies fever, chills, chest pain, shortness of breath, cough, abdominal pain, dysuria, diarrhea.  Denies recent travel or trauma.  Denies personal history of kidney stones.   Past Medical History:  Diagnosis Date  . Arthritis   . Carotid bruit    right   . Chicken pox   . Hypertension   . Migraine   . Other ill-defined heart disease     Patient Active Problem List   Diagnosis Date Noted  . Prediabetes 07/07/2016  . Hyperlipidemia 07/07/2016  . Preventative health care 07/05/2015  . Knee pain 08/06/2014  . Essential hypertension 07/12/2014  . Left-sided low back pain with left-sided sciatica 07/12/2014  . Plantar fasciitis 08/11/2011  . 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 Back pain 08/11/2011    Past Surgical History:  Procedure Laterality Date  . BREAST CYST ASPIRATION     at age 20, possibly left breast  . CESAREAN SECTION    . TUBAL LIGATION      Prior to Admission medications   Medication Sig Start Date End Date Taking? Authorizing Provider  amLODipine (NORVASC) 10 MG tablet Take 1 tablet (10 mg total) by mouth daily. 08/17/16   Doreene Nest, NP    Allergies Patient has no known allergies.  Family History  Problem Relation Age of Onset  . Heart attack Mother   . Hypertension  Mother     Social History Social History   Tobacco Use  . Smoking status: Never Smoker  . Smokeless tobacco: Never Used  Substance Use Topics  . Alcohol use: Yes    Alcohol/week: 0.6 oz    Types: 1 Standard drinks or equivalent per week  . Drug use: No    Review of Systems  Constitutional: No fever/chills Eyes: No visual changes. ENT: No sore throat. Cardiovascular: Denies chest pain. Respiratory: Denies shortness of breath. Gastrointestinal: No abdominal pain.  Positive for nausea, no vomiting.  No diarrhea.  No constipation. Genitourinary: Positive for urinary frequency.  Negative for dysuria. Musculoskeletal: Positive for right back pain. Skin: Negative for rash. Neurological: Negative for headaches, focal weakness or numbness.   ____________________________________________   PHYSICAL EXAM:  VITAL SIGNS: ED Triage Vitals  Enc Vitals Group     BP 09/09/17 0553 132/72     Pulse Rate 09/09/17 0553 71     Resp 09/09/17 0553 17     Temp 09/09/17 0553 98.1 F (36.7 C)     Temp Source 09/09/17 0553 Oral     SpO2 09/09/17 0553 100 %     Weight 09/09/17 0542 199 lb (90.3 kg)     Height 09/09/17 0542 5\' 6"  (1.676 m)     Head Circumference --      Peak Flow --      Pain Score 09/09/17 0542 7  Pain Loc --      Pain Edu? --      Excl. in GC? --     Constitutional: Alert and oriented. Well appearing and in mild acute distress. Eyes: Conjunctivae are normal. PERRL. EOMI. Head: Atraumatic. Nose: No congestion/rhinnorhea. Mouth/Throat: Mucous membranes are moist.  Oropharynx non-erythematous. Neck: No stridor.   Cardiovascular: Normal rate, regular rhythm. Grossly normal heart sounds.  Good peripheral circulation. Respiratory: Normal respiratory effort.  No retractions. Lungs CTAB.  Mild right thoracic lower rib tender to palpation.  No crepitus.  No splinting. Gastrointestinal: Soft and nontender to light or deep palpation. No distention. No abdominal bruits.   Mild right CVA tenderness. Musculoskeletal: No spinal tenderness to palpation.  No lower extremity tenderness nor edema.  No joint effusions. Neurologic:  Normal speech and language. No gross focal neurologic deficits are appreciated. No gait instability. Skin:  Skin is warm, dry and intact. No rash noted. No vesicles. Psychiatric: Mood and affect are normal. Speech and behavior are normal.  ____________________________________________   LABS (all labs ordered are listed, but only abnormal results are displayed)  Labs Reviewed  URINALYSIS, ROUTINE W REFLEX MICROSCOPIC - Abnormal; Notable for the following components:      Result Value   Color, Urine YELLOW (*)    APPearance CLEAR (*)    Protein, ur 30 (*)    All other components within normal limits  CBC WITH DIFFERENTIAL/PLATELET  BASIC METABOLIC PANEL   ____________________________________________  EKG  None ____________________________________________  RADIOLOGY  ED MD interpretation: Pending  Official radiology report(s): No results found.  ____________________________________________   PROCEDURES  Procedure(s) performed: None  Procedures  Critical Care performed: No  ____________________________________________   INITIAL IMPRESSION / ASSESSMENT AND PLAN / ED COURSE  As part of my medical decision making, I reviewed the following data within the electronic MEDICAL RECORD NUMBER Nursing notes reviewed and incorporated, Labs reviewed, Old chart reviewed and Notes from prior ED visits   57 year old female who presents with right flank pain. Differential diagnosis includes, but is not limited to, ovarian cyst, ovarian torsion, acute appendicitis, diverticulitis, urinary tract infection/pyelonephritis, endometriosis, bowel obstruction, colitis, renal colic, gastroenteritis, hernia, fibroids, endometriosis, musculoskeletal strain, etc.  Urinalysis is unremarkable.  Patient appears uncomfortable.  Will obtain basic  lab work, CT renal colic protocol.  Initiate IV fluid resuscitation, 10 mg IV Toradol for pain paired with 4 mg IV Zofran for nausea.   Clinical Course as of Sep 09 657  Thu Sep 09, 2017  [id number] Care transferred to Dr. Pershing ProudSchaevitz pending results of lab work, CT scan and disposition.   [JS]    Clinical Course User Index [JS] Irean HongSung, Sylvan Sookdeo J, MD     ____________________________________________   FINAL CLINICAL IMPRESSION(S) / ED DIAGNOSES  Final diagnoses:  Right flank pain     ED Discharge Orders    None       Note:  This document was prepared using Dragon voice recognition software and may include unintentional dictation errors.    Irean HongSung, Shaquile Lutze J, MD 09/09/17 [phone]

## 2017-09-13 ENCOUNTER — Emergency Department
Admission: EM | Admit: 2017-09-13 | Discharge: 2017-09-13 | Disposition: A | Discharge status: Home / Self Care | Payer: BLUE CROSS/BLUE SHIELD | Attending: Emergency Medicine | Admitting: Emergency Medicine

## 2017-09-13 ENCOUNTER — Telehealth: Payer: Self-pay

## 2017-09-13 ENCOUNTER — Other Ambulatory Visit: Payer: Self-pay

## 2017-09-13 DIAGNOSIS — X509XXA Other and unspecified overexertion or strenuous movements or postures, initial encounter: Secondary | ICD-10-CM | POA: Insufficient documentation

## 2017-09-13 DIAGNOSIS — Y939 Activity, unspecified: Secondary | ICD-10-CM | POA: Diagnosis not present

## 2017-09-13 DIAGNOSIS — Z79899 Other long term (current) drug therapy: Secondary | ICD-10-CM | POA: Diagnosis not present

## 2017-09-13 DIAGNOSIS — I10 Essential (primary) hypertension: Secondary | ICD-10-CM | POA: Diagnosis not present

## 2017-09-13 DIAGNOSIS — Y929 Unspecified place or not applicable: Secondary | ICD-10-CM | POA: Diagnosis not present

## 2017-09-13 DIAGNOSIS — S3992XA Unspecified injury of lower back, initial encounter: Secondary | ICD-10-CM | POA: Diagnosis present

## 2017-09-13 DIAGNOSIS — S39012A Strain of muscle, fascia and tendon of lower back, initial encounter: Secondary | ICD-10-CM | POA: Diagnosis not present

## 2017-09-13 DIAGNOSIS — Y999 Unspecified external cause status: Secondary | ICD-10-CM | POA: Diagnosis not present

## 2017-09-13 LAB — URINALYSIS, COMPLETE (UACMP) WITH MICROSCOPIC
Bilirubin Urine: NEGATIVE
Glucose, UA: NEGATIVE mg/dL
KETONES UR: NEGATIVE mg/dL
LEUKOCYTES UA: NEGATIVE
Nitrite: NEGATIVE
PROTEIN: NEGATIVE mg/dL
Specific Gravity, Urine: 1.008 (ref 1.005–1.030)
pH: 6 (ref 5.0–8.0)

## 2017-09-13 LAB — POCT PREGNANCY, URINE: PREG TEST UR: NEGATIVE

## 2017-09-13 MED ORDER — NAPROXEN 500 MG PO TABS: 500.0000 mg | ORAL_TABLET | Freq: Two times a day (BID) | ORAL | 0 refills | Status: DC

## 2017-09-13 NOTE — Telephone Encounter (Signed)
Noted. Patient evaluated and discharged from ED. Appears to be [facility] cause.

## 2017-09-13 NOTE — ED Notes (Signed)
Pt c/o chest pressure. EKG performed. EDP aware.

## 2017-09-13 NOTE — ED Triage Notes (Signed)
Pt c/o right flank pain since last Thursday when she was seen here for the same. Denies injury. Denies painful urination.. Denies N/V/D/fever.

## 2017-09-13 NOTE — ED Provider Notes (Signed)
[facility] Emergency Department Provider Note  ____________________________________________  Time seen: Approximately 11:05 AM  I have reviewed the triage vital signs and the nursing notes.   HISTORY  Chief Complaint Flank Pain    HPI Tara Marshall is a 57 y.o. female with a history of hypertension and hyperlipidemia who complains of right lower back pain for the past 4 days.  Intermittent and waxing and waning.  Worse with movement and leaning over.  Better with Voltaren gel and heat therapy.  Mild to moderate..   No fevers chills or sweats.  No exertional symptoms.  Shortness of breath, nonpleuritic.  No vomiting or diarrhea.  No recent travel trauma hospitalization or surgery.  No leg swelling or pain.  No history of DVT or PE.     Past Medical History:  Diagnosis Date  . Arthritis   . Carotid bruit    right   . Chicken pox   . Hypertension   . Migraine   . Other ill-defined heart disease      Patient Active Problem List   Diagnosis Date Noted  . Prediabetes 07/07/2016  . Hyperlipidemia 07/07/2016  . Preventative health care 07/05/2015  . Knee pain 08/06/2014  . Essential hypertension 07/12/2014  . Left-sided low back pain with left-sided sciatica 07/12/2014  . Plantar fasciitis 08/11/2011  . 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 Back pain 08/11/2011     Past Surgical History:  Procedure Laterality Date  . BREAST CYST ASPIRATION     at age 50, possibly left breast  . CESAREAN SECTION    . TUBAL LIGATION       Prior to Admission medications   Medication Sig Start Date End Date Taking? Authorizing Provider  amLODipine (NORVASC) 10 MG tablet Take 1 tablet (10 mg total) by mouth daily. 08/17/16   Doreene Nest, NP  carisoprodol (SOMA) 350 MG tablet Take 1 tablet (350 mg total) by mouth 3 (three) times daily as needed. 09/09/17   Schaevitz, Myra Rude, MD  diclofenac sodium (VOLTAREN) 1 % GEL Apply to  affected area qid prn pain 09/09/17   Schaevitz, Myra Rude, MD  naproxen (NAPROSYN) 500 MG tablet Take 1 tablet (500 mg total) by mouth 2 (two) times daily with a meal. 09/13/17   Sharman Cheek, MD     Allergies Patient has no known allergies.   Family History  Problem Relation Age of Onset  . Heart attack Mother   . Hypertension Mother     Social History Social History   Tobacco Use  . Smoking status: Never Smoker  . Smokeless tobacco: Never Used  Substance Use Topics  . Alcohol use: Yes    Alcohol/week: 0.6 oz    Types: 1 Standard drinks or equivalent per week  . Drug use: No    Review of Systems  Constitutional:   No fever or chills.  Cardiovascular:   No chest pain or syncope. Respiratory:   No dyspnea or cough. Gastrointestinal:   Negative for abdominal pain, vomiting and diarrhea.  Musculoskeletal: Right lower back pain as above All other systems reviewed and are negative except as documented above in ROS and HPI.  ____________________________________________   PHYSICAL EXAM:  VITAL SIGNS: ED Triage Vitals [09/13/17 1017]  Enc Vitals Group     BP (!) 148/106     Pulse Rate 71     Resp 17     Temp 98.1 F (36.7 C)     Temp Source  Oral     SpO2 98 %     Weight 199 lb (90.3 kg)     Height 5\' 6"  (1.676 m)     Head Circumference      Peak Flow      Pain Score 8     Pain Loc      Pain Edu?      Excl. in GC?     Vital signs reviewed, nursing assessments reviewed.   Constitutional:   Alert and oriented. Non-toxic appearance. Eyes:   Conjunctivae are normal. EOMI. PERRL. ENT      Head:   Normocephalic and atraumatic.      Nose:   No congestion/rhinnorhea.       Mouth/Throat:   MMM, no pharyngeal erythema. No peritonsillar mass.       Neck:   No meningismus. Full ROM. Hematological/Lymphatic/Immunilogical:   No cervical lymphadenopathy. Cardiovascular:   RRR. Symmetric bilateral radial and DP pulses.  No murmurs. Cap refill less than 2  seconds. Respiratory:   Normal respiratory effort without tachypnea/retractions. Breath sounds are clear and equal bilaterally. No wheezes/rales/rhonchi. Gastrointestinal:   Soft and nontender. Non distended. There is no CVA tenderness.  No rebound, rigidity, or guarding. Musculoskeletal:   Normal range of motion in all extremities. No joint effusions.  No lower extremity tenderness.  No edema.  There is focal tenderness in the paraspinous musculature lateral to L1-L2 on the right.  This reproduces her symptoms. Neurologic:   Normal speech and language.  Motor grossly intact. No acute focal neurologic deficits are appreciated.  Skin:    Skin is warm, dry and intact. No rash noted.  No petechiae, purpura, or bullae.  ____________________________________________    LABS (pertinent positives/negatives) (all labs ordered are listed, but only abnormal results are displayed) Labs Reviewed  URINALYSIS, COMPLETE (UACMP) WITH MICROSCOPIC - Abnormal; Notable for the following components:      Result Value   Color, Urine STRAW (*)    APPearance CLEAR (*)    Hgb urine dipstick SMALL (*)    Bacteria, UA RARE (*)    All other components within normal limits  POCT PREGNANCY, URINE   ____________________________________________   EKG  Interpreted by me Sinus rhythm rate of 55, normal axis and intervals.  Normal QRS ST segments and T waves.  ____________________________________________    RADIOLOGY  No results found.  ____________________________________________   PROCEDURES Procedures  ____________________________________________    CLINICAL IMPRESSION / ASSESSMENT AND PLAN / ED COURSE  Pertinent labs & imaging results that were available during my care of the patient were reviewed by me and considered in my medical decision making (see chart for details).    Who comes back to the ED with right lower back pain for which she was previously evaluated with an extensive work-up  which was unremarkable.  Vital signs are unremarkable.  Symptoms and exam today are consistent with a low back muscular strain.  Doubt ACS PE dissection cholecystitis pancreatitis AAA bowel obstruction or perforation.  She is well-appearing.  Recommended adding on oral NSAIDs, reinforced heat therapy safety.  Follow-up with primary care.      ____________________________________________   FINAL CLINICAL IMPRESSION(S) / ED DIAGNOSES    Final diagnoses:  Low back strain, initial encounter     ED Discharge Orders        Ordered    naproxen (NAPROSYN) 500 MG tablet  2 times daily with meals     09/13/17 1104      Portions of  this note were generated with dragon dictation software. Dictation errors may occur despite best attempts at proofreading.    Sharman Cheek, MD 09/13/17 1109

## 2017-09-13 NOTE — ED Notes (Signed)
Pt ambulatory upon discharge; declined wheel chair. Verbalized understanding of discharge instructions, follow-up care and prescription. VSS. Skin warm and dry. A&O x4.  

## 2017-09-13 NOTE — Telephone Encounter (Signed)
Pt walked in; was seen ED on 09/09/17 for rt flank pain. Pt taking soma and using diclofenac 1% as instructed with no pain relief; heat helps for short period of time. Pain level now is 8. No known injury. Pt never had this type pain before. Pain comes and goes during day but consistent at night. PCP out of office and no available appts this morning and pt has to be seen prior to 2 pm for transportation reasons. [facility] ED [facility]. And pt will cb to schedule ED f/u appt with Mayra ReelKate Clark NP. Vitals now T 98.5 P 58  BP 134/80 and pulse ox room air 98%. FYI to Mayra ReelKate Clark NP.

## 2017-09-13 NOTE — ED Notes (Signed)
ED Provider at bedside. 

## 2017-10-25 ENCOUNTER — Other Ambulatory Visit: Payer: Self-pay | Admitting: Primary Care

## 2017-10-25 DIAGNOSIS — E785 Hyperlipidemia, unspecified: Secondary | ICD-10-CM

## 2017-10-25 DIAGNOSIS — I10 Essential (primary) hypertension: Secondary | ICD-10-CM

## 2017-10-25 DIAGNOSIS — R7303 Prediabetes: Secondary | ICD-10-CM

## 2017-11-03 ENCOUNTER — Other Ambulatory Visit (INDEPENDENT_AMBULATORY_CARE_PROVIDER_SITE_OTHER): Payer: BLUE CROSS/BLUE SHIELD

## 2017-11-03 ENCOUNTER — Encounter (INDEPENDENT_AMBULATORY_CARE_PROVIDER_SITE_OTHER): Payer: Self-pay

## 2017-11-03 DIAGNOSIS — R7303 Prediabetes: Secondary | ICD-10-CM

## 2017-11-03 DIAGNOSIS — E785 Hyperlipidemia, unspecified: Secondary | ICD-10-CM | POA: Diagnosis not present

## 2017-11-03 DIAGNOSIS — I10 Essential (primary) hypertension: Secondary | ICD-10-CM

## 2017-11-03 LAB — LIPID PANEL
CHOL/HDL RATIO: 3
Cholesterol: 208 mg/dL — ABNORMAL HIGH (ref 0–200)
HDL: 76.4 mg/dL (ref >39.00)
LDL CALC: 118 mg/dL — AB (ref 0–99)
NONHDL: 131.37
Triglycerides: 67 mg/dL (ref 0.0–149.0)
VLDL: 13.4 mg/dL (ref 0.0–40.0)

## 2017-11-03 LAB — COMPREHENSIVE METABOLIC PANEL
ALT: 17 U/L (ref 0–35)
AST: 14 U/L (ref 0–37)
Albumin: 4.4 g/dL (ref 3.5–5.2)
Alkaline Phosphatase: 67 U/L (ref 39–117)
BUN: 12 mg/dL (ref 6–23)
CHLORIDE: 103 meq/L (ref 96–112)
CO2: 30 meq/L (ref 19–32)
Calcium: 9.6 mg/dL (ref 8.4–10.5)
Creatinine, Ser: 0.7 mg/dL (ref 0.40–1.20)
GFR: 109.93 mL/min (ref >60.00)
GLUCOSE: 97 mg/dL (ref 70–99)
Potassium: 3.7 mEq/L (ref 3.5–5.1)
Sodium: 140 mEq/L (ref 135–145)
Total Bilirubin: 0.6 mg/dL (ref 0.2–1.2)
Total Protein: 7.7 g/dL (ref 6.0–8.3)

## 2017-11-03 LAB — HEMOGLOBIN A1C: HEMOGLOBIN A1C: 5.9 % (ref 4.6–6.5)

## 2017-11-08 ENCOUNTER — Encounter: Payer: Self-pay | Admitting: Primary Care

## 2017-11-08 ENCOUNTER — Other Ambulatory Visit (HOSPITAL_COMMUNITY)
Admission: RE | Admit: 2017-11-08 | Discharge: 2017-11-08 | Disposition: A | Discharge status: Home / Self Care | Payer: BLUE CROSS/BLUE SHIELD | Source: Ambulatory Visit | Attending: Primary Care | Admitting: Primary Care

## 2017-11-08 ENCOUNTER — Ambulatory Visit (INDEPENDENT_AMBULATORY_CARE_PROVIDER_SITE_OTHER): Payer: BLUE CROSS/BLUE SHIELD | Admitting: Primary Care

## 2017-11-08 VITALS — BP 130/80 | HR 78 | Temp 98.0°F | Ht 66.0 in | Wt 211.8 lb

## 2017-11-08 DIAGNOSIS — I10 Essential (primary) hypertension: Secondary | ICD-10-CM | POA: Diagnosis not present

## 2017-11-08 DIAGNOSIS — Z0000 Encounter for general adult medical examination without abnormal findings: Secondary | ICD-10-CM | POA: Diagnosis not present

## 2017-11-08 DIAGNOSIS — M5442 Lumbago with sciatica, left side: Secondary | ICD-10-CM | POA: Diagnosis not present

## 2017-11-08 DIAGNOSIS — Z124 Encounter for screening for malignant neoplasm of cervix: Secondary | ICD-10-CM | POA: Diagnosis not present

## 2017-11-08 DIAGNOSIS — R7303 Prediabetes: Secondary | ICD-10-CM | POA: Diagnosis not present

## 2017-11-08 DIAGNOSIS — E785 Hyperlipidemia, unspecified: Secondary | ICD-10-CM

## 2017-11-08 DIAGNOSIS — Z1231 Encounter for screening mammogram for malignant neoplasm of breast: Secondary | ICD-10-CM

## 2017-11-08 DIAGNOSIS — Z1239 Encounter for other screening for malignant neoplasm of breast: Secondary | ICD-10-CM

## 2017-11-08 NOTE — Assessment & Plan Note (Signed)
Overall stable. Uses Aleve PRN. Continue regular exercise.

## 2017-11-08 NOTE — Assessment & Plan Note (Signed)
Td UTD, declines influenza vaccination. Pap smear due today, completed. Mammogram due, ordered. Colonoscopy UTD. Exam unremarkable. Labs reviewed. Follow up in 1 year for CPE.

## 2017-11-08 NOTE — Assessment & Plan Note (Signed)
Stable in the office today without medication, home readings stable. Will have her continue to monitor.

## 2017-11-08 NOTE — Progress Notes (Signed)
Subjective:    Patient ID: Tara Marshall, female    DOB: [birth year], 57 y.o.   MRN: [id number]  HPI  Tara Marshall is a 57 year old female who presents today for complete physical.  Immunizations: -Tetanus: Completed in 2012 -Influenza: Declines   Diet: She endorses a poor diet Breakfast: Slim fast, protein shakes, smoothie  Lunch: Tacos, baked chicken, vegetables Dinner: Cereal, crackers with cheese, fruit Snacks: Peanut butter sandwich Desserts: Cookies, candy. Several times weekly  Beverages: Soda, cranberry juice, water  Exercise: She is walking 5 days weekly, 5 miles  Eye exam: Completed in 2018 Dental exam: Completes annually  Colonoscopy: Completed in 2016 Pap Smear: Due today Mammogram: Completed in 2017  BP Readings from Last 3 Encounters:  11/08/17 130/80  09/13/17 (!) 166/81  09/09/17 110/90   She's checking her BP at home which is running 120-130's/70-80's.    Review of Systems  Constitutional: Negative for unexpected weight change.  HENT: Negative for rhinorrhea.   Respiratory: Negative for cough and shortness of breath.   Cardiovascular: Negative for chest pain.  Gastrointestinal: Negative for constipation and diarrhea.  Genitourinary: Negative for difficulty urinating and menstrual problem.  Musculoskeletal: Negative for arthralgias and myalgias.  Skin: Negative for rash.  Allergic/Immunologic: Negative for environmental allergies.  Neurological: Negative for dizziness, numbness and headaches.  Psychiatric/Behavioral: The patient is not nervous/anxious.        Increased stress in personal life.       Past Medical History:  Diagnosis Date  . Arthritis   . Carotid bruit    right   . Chicken pox   . Hypertension   . Migraine   . Other ill-defined heart disease      Social History   Socioeconomic History  . Marital status: Married    Spouse name: Not on file  . Number of children: Not on file  . Years of education: Not on file  . Highest  education level: Not on file  Occupational History  . Not on file  Social Needs  . Financial resource strain: Not on file  . Food insecurity:    Worry: Not on file    Inability: Not on file  . Transportation needs:    Medical: Not on file    Non-medical: Not on file  Tobacco Use  . Smoking status: Never Smoker  . Smokeless tobacco: Never Used  Substance and Sexual Activity  . Alcohol use: Yes    Alcohol/week: 1.0 standard drinks    Types: 1 Standard drinks or equivalent per week  . Drug use: No  . Sexual activity: Not on file  Lifestyle  . Physical activity:    Days per week: Not on file    Minutes per session: Not on file  . Stress: Not on file  Relationships  . Social connections:    Talks on phone: Not on file    Gets together: Not on file    Attends religious service: Not on file    Active member of club or organization: Not on file    Attends meetings of clubs or organizations: Not on file    Relationship status: Not on file  . Intimate partner violence:    Fear of current or ex partner: Not on file    Emotionally abused: Not on file    Physically abused: Not on file    Forced sexual activity: Not on file  Other Topics Concern  . Not on file  Social History Narrative  Married.   3 children   Highest level of education 12th grade.   Works as a Arts development officer.   Enjoys playing on the computer, walking, organizing.    Past Surgical History:  Procedure Laterality Date  . BREAST CYST ASPIRATION     at age 40, possibly left breast  . CESAREAN SECTION    . TUBAL LIGATION      Family History  Problem Relation Age of Onset  . Heart attack Mother   . Hypertension Mother     No Known Allergies  Current Outpatient Medications on File Prior to Visit  Medication Sig Dispense Refill  . amLODipine (NORVASC) 10 MG tablet Take 1 tablet (10 mg total) by mouth daily. (Patient not taking: Reported on 11/08/2017) 90 tablet 2   No current facility-administered  medications on file prior to visit.     BP 130/80   Pulse 78   Temp 98 F (36.7 C) (Oral)   Ht 5\' 6"  (1.676 m)   Wt 211 lb 12 oz (96 kg)   SpO2 98%   BMI 34.18 kg/m    Objective:   Physical Exam  Constitutional: She is oriented to person, place, and time. She appears well-nourished.  HENT:  Mouth/Throat: No oropharyngeal exudate.  Eyes: Pupils are equal, round, and reactive to light. EOM are normal.  Neck: Neck supple. No thyromegaly present.  Cardiovascular: Normal rate and regular rhythm.  Respiratory: Effort normal and breath sounds normal.  GI: Soft. Bowel sounds are normal. There is no tenderness.  Musculoskeletal: Normal range of motion.  Neurological: She is alert and oriented to person, place, and time.  Skin: Skin is warm and dry.  Psychiatric: She has a normal mood and affect.           Assessment & Plan:

## 2017-11-08 NOTE — Assessment & Plan Note (Signed)
Chronic and about the same as last year.  Discussed the importance of a healthy diet and regular exercise in order for weight loss, and to reduce the risk of any potential medical problems. Continue to monitor.

## 2017-11-08 NOTE — Patient Instructions (Signed)
Call the [facility] to schedule your mammogram.   Continue exercising. You should be getting 150 minutes of moderate intensity exercise weekly.  Ensure you are consuming 64 ounces of water daily.  It's important to improve your diet by reducing consumption of fast food, fried food, processed snack foods, sugary drinks. Increase consumption of fresh vegetables and fruits, whole grains, water.  We will be in touch once we receive your pap smear results.  It was a pleasure to see you today!   Preventive Care 40-64 Years, Female Preventive care refers to lifestyle choices and visits with your health care provider that can promote health and wellness. What does preventive care include?  A yearly physical exam. This is also called an annual well check.  Dental exams once or twice a year.  Routine eye exams. Ask your health care provider how often you should have your eyes checked.  Personal lifestyle choices, including: ? Daily care of your teeth and gums. ? Regular physical activity. ? Eating a healthy diet. ? Avoiding tobacco and drug use. ? Limiting alcohol use. ? Practicing safe sex. ? Taking low-dose aspirin daily starting at age 65. ? Taking vitamin and mineral supplements as recommended by your health care provider. What happens during an annual well check? The services and screenings done by your health care provider during your annual well check will depend on your age, overall health, lifestyle risk factors, and family history of disease. Counseling Your health care provider may ask you questions about your:  Alcohol use.  Tobacco use.  Drug use.  Emotional well-being.  Home and relationship well-being.  Sexual activity.  Eating habits.  Work and work Statistician.  Method of birth control.  Menstrual cycle.  Pregnancy history.  Screening You may have the following tests or measurements:  Height, weight, and BMI.  Blood pressure.  Lipid  and cholesterol levels. These may be checked every 5 years, or more frequently if you are over 18 years old.  Skin check.  Lung cancer screening. You may have this screening every year starting at age 66 if you have a 30-pack-year history of smoking and currently smoke or have quit within the past 15 years.  Fecal occult blood test (FOBT) of the stool. You may have this test every year starting at age 53.  Flexible sigmoidoscopy or colonoscopy. You may have a sigmoidoscopy every 5 years or a colonoscopy every 10 years starting at age 51.  Hepatitis C blood test.  Hepatitis B blood test.  Sexually transmitted disease (STD) testing.  Diabetes screening. This is done by checking your blood sugar (glucose) after you have not eaten for a while (fasting). You may have this done every 1-3 years.  Mammogram. This may be done every 1-2 years. Talk to your health care provider about when you should start having regular mammograms. This may depend on whether you have a family history of breast cancer.  BRCA-related cancer screening. This may be done if you have a family history of breast, ovarian, tubal, or peritoneal cancers.  Pelvic exam and Pap test. This may be done every 3 years starting at age 71. Starting at age 8, this may be done every 5 years if you have a Pap test in combination with an HPV test.  Bone density scan. This is done to screen for osteoporosis. You may have this scan if you are at high risk for osteoporosis.  Discuss your test results, treatment options, and if necessary, the need for  more tests with your health care provider. Vaccines Your health care provider may recommend certain vaccines, such as:  Influenza vaccine. This is recommended every year.  Tetanus, diphtheria, and acellular pertussis (Tdap, Td) vaccine. You may need a Td booster every 10 years.  Varicella vaccine. You may need this if you have not been vaccinated.  Zoster vaccine. You may need this after  age 9.  Measles, mumps, and rubella (MMR) vaccine. You may need at least one dose of MMR if you were born in 1957 or later. You may also need a second dose.  Pneumococcal 13-valent conjugate (PCV13) vaccine. You may need this if you have certain conditions and were not previously vaccinated.  Pneumococcal polysaccharide (PPSV23) vaccine. You may need one or two doses if you smoke cigarettes or if you have certain conditions.  Meningococcal vaccine. You may need this if you have certain conditions.  Hepatitis A vaccine. You may need this if you have certain conditions or if you travel or work in places where you may be exposed to hepatitis A.  Hepatitis B vaccine. You may need this if you have certain conditions or if you travel or work in places where you may be exposed to hepatitis B.  Haemophilus influenzae type b (Hib) vaccine. You may need this if you have certain conditions.  Talk to your health care provider about which screenings and vaccines you need and how often you need them. This information is not intended to replace advice given to you by your health care provider. Make sure you discuss any questions you have with your health care provider. Document Released: 02/22/2015 Document Revised: 10/16/2015 Document Reviewed: 11/27/2014 Elsevier Interactive Patient Education  [employer].

## 2017-11-08 NOTE — Assessment & Plan Note (Signed)
Borderline. Discussed to work on diet and increase exercise.

## 2017-11-09 LAB — CYTOLOGY - PAP
Adequacy: ABSENT
Diagnosis: NEGATIVE
HPV (WINDOPATH): NOT DETECTED

## 2017-11-11 ENCOUNTER — Encounter: Payer: Self-pay | Admitting: *Deleted

## 2017-12-17 ENCOUNTER — Ambulatory Visit
Admission: RE | Admit: 2017-12-17 | Discharge: 2017-12-17 | Disposition: A | Discharge status: Home / Self Care | Payer: BLUE CROSS/BLUE SHIELD | Source: Ambulatory Visit | Attending: Primary Care | Admitting: Primary Care

## 2017-12-17 DIAGNOSIS — Z1239 Encounter for other screening for malignant neoplasm of breast: Secondary | ICD-10-CM | POA: Diagnosis present

## 2018-04-14 ENCOUNTER — Telehealth: Payer: Self-pay

## 2018-04-14 DIAGNOSIS — I10 Essential (primary) hypertension: Secondary | ICD-10-CM

## 2018-04-14 NOTE — Telephone Encounter (Signed)
Pt last seen 11/09/18. Pt was not taking amlodipine at that visit. Pt said lately her BP has been going up to 138/78 - 148/80. Pt has been cking BP 2 -3 x a wk. Pt had few amlodipine at home and has restarted amlodipine and needs a refill. Pt did add that when she takes the amlodipine 10 mg she gets a h/a in middle of forehead for several hours that is not an extreme h/a but is agitating to pt. Pt request cb when Allayne Gitelman NP reviews note.[facility] [address].

## 2018-04-15 MED ORDER — LOSARTAN POTASSIUM 25 MG PO TABS: 25.0000 mg | ORAL_TABLET | Freq: Every day | ORAL | 0 refills | Status: DC

## 2018-04-15 NOTE — Telephone Encounter (Signed)
Please notify patient to stop the amlodipine for blood pressure. I will switch her to losartan 25 mg.  Take 1 tablet daily. We will need to see her in the office for follow-up in 2 to 3 weeks for blood pressure check.  Please schedule.

## 2018-04-15 NOTE — Telephone Encounter (Signed)
Spoken and notified patient of Tara Marshall comments. Patient verbalized understanding.  Follow up on 05/04/2018

## 2018-05-04 ENCOUNTER — Telehealth (INDEPENDENT_AMBULATORY_CARE_PROVIDER_SITE_OTHER): Payer: BLUE CROSS/BLUE SHIELD | Admitting: Primary Care

## 2018-05-04 ENCOUNTER — Other Ambulatory Visit: Payer: Self-pay

## 2018-05-04 DIAGNOSIS — I10 Essential (primary) hypertension: Secondary | ICD-10-CM

## 2018-05-04 MED ORDER — LOSARTAN POTASSIUM 25 MG PO TABS: 25.0000 mg | ORAL_TABLET | Freq: Every day | ORAL | 0 refills | Status: DC

## 2018-05-04 NOTE — Assessment & Plan Note (Signed)
Elevated blood pressure readings in the last 1 month, could not tolerate resuming amlodipine.  No headaches since discontinuation of amlodipine.  She has an unreliable source for blood pressure checks as she is checking at various pharmacies.  We discussed for her to purchase her own cuff to use consistently at home.  Continue losartan 25 mg for now.  We will bring her into the office in 2 weeks for blood pressure check and BMP.  She verbalized understanding of these instructions.

## 2018-05-04 NOTE — Progress Notes (Signed)
   Tara Marshall - 58 y.o. female  MRN [id number]  Date of Birth: Jul 24, 1960  PCP: Doreene Nest, NP  This service was provided via telemedicine. Phone Visit performed on 05/04/2018    Rationale for phone visit reviewed. Patient consented to telephone encounter.    Location of patient: Home Location of provider: Office of [facility] Name of referring provider: N/A   Names of persons and role in encounter: Provider: Doreene Nest, NP  Patient: Tara Marshall  Other: N/A   Time on call: 14 minutes and 50 seconds   Subjective: CC: Hypertension Follow Up HPI:  Tara Marshall is a 58 year old female who presents today via phone for follow-up of hypertension.  She was last evaluated in late September 2019, had been off of her amlodipine for several months with stable office and home blood pressure readings.  She phoned into our office on April 14, 2018 with reports of elevated blood pressure readings at home including 138/70 and 148/80.  She had a few of her amlodipine tablets remaining in her prior prescription so she took a few and endorsed a severe frontal lobe headache.  Because of this we switched her regimen to losartan 25 mg.  She was asked to follow-up for repeat blood pressure check.  Today via phone she endorses that her headaches stopped after she discontinued the Amlodipine. Over the last one month she noticed bilateral lower extremity swelling, this prompted her to start rechecking her blood pressure. She is checking her BP at [facility] since 119/69, 123/75, 143/89, 135/89, 153/86. Most readings are 130's-150's/80's. She is checking her BP once every other day. She has noticed some improvement in lower extremity swelling. She denies dizziness, chest pain, headaches.   Objective/Observations:   No physical exam or vital signs collected unless specifically identified.  Respiratory status: speaks in complete sentences without evident shortness of breath.    Assessment/Plan:  Readings from pharmacies are varied, question the accuracy of the pharmacy BP machines.  Discussed for her to purchase a home BP cuff or borrow her brothers. Discussed to check using the same arm, once daily, around the same time of day.  We will continue her dose of losartan at 25 mg as we don't have reliable BP readings. We will have her start monitoring blood pressure with a store purchased cuff and return to our office in 2 weeks for blood pressure check and BMP.  Continue losartan 25 mg daily, refill sent to pharmacy.  She verbalized understanding of these instructions.   No problem-specific Assessment & Plan notes found for this encounter.   I discussed the assessment and treatment plan with the patient. The patient was provided an opportunity to ask questions and all were answered. The patient agreed with the plan and demonstrated an understanding of the instructions.  Lab Orders  No laboratory test(s) ordered today    No orders of the defined types were placed in this encounter.   The patient was advised to call back or seek an in-person evaluation if the symptoms worsen or if the condition fails to improve as anticipated.  Doreene Nest, NP

## 2018-05-04 NOTE — Patient Instructions (Signed)
Start monitoring your blood pressure daily, around the same time of day, for the next 2-3 weeks.  Purchased a cuff at the store or bar your brothers cuff.  Ensure that you have rested for 30 minutes prior to checking your blood pressure. Record your readings and bring them to your next visit.  Continue losartan 25 mg once daily for blood pressure.  We will see you in 2 weeks for a blood pressure check and lab test.  It was nice talking with you!

## 2018-05-18 ENCOUNTER — Other Ambulatory Visit: Payer: Self-pay

## 2018-05-18 ENCOUNTER — Ambulatory Visit (INDEPENDENT_AMBULATORY_CARE_PROVIDER_SITE_OTHER): Payer: BLUE CROSS/BLUE SHIELD | Admitting: Primary Care

## 2018-05-18 ENCOUNTER — Encounter: Payer: Self-pay | Admitting: Primary Care

## 2018-05-18 VITALS — BP 140/90 | HR 80 | Temp 98.2°F | Ht 66.0 in | Wt 220.5 lb

## 2018-05-18 DIAGNOSIS — I10 Essential (primary) hypertension: Secondary | ICD-10-CM

## 2018-05-18 DIAGNOSIS — R7303 Prediabetes: Secondary | ICD-10-CM | POA: Diagnosis not present

## 2018-05-18 LAB — BASIC METABOLIC PANEL
BUN: 9 mg/dL (ref 6–23)
CO2: 29 mEq/L (ref 19–32)
Calcium: 9.7 mg/dL (ref 8.4–10.5)
Chloride: 105 mEq/L (ref 96–112)
Creatinine, Ser: 0.74 mg/dL (ref 0.40–1.20)
GFR: 96.83 mL/min (ref >60.00)
Glucose, Bld: 71 mg/dL (ref 70–99)
Potassium: 3.6 mEq/L (ref 3.5–5.1)
Sodium: 143 mEq/L (ref 135–145)

## 2018-05-18 LAB — HEMOGLOBIN A1C: Hgb A1c MFr Bld: 5.9 % (ref 4.6–6.5)

## 2018-05-18 MED ORDER — LOSARTAN POTASSIUM 50 MG PO TABS: 50.0000 mg | ORAL_TABLET | Freq: Every day | ORAL | 0 refills | Status: DC

## 2018-05-18 NOTE — Progress Notes (Signed)
Subjective:    Patient ID: Tara Marshall, female    DOB: 09/12/1960, 58 y.o.   MRN: [id number]  HPI  Ms. Mcgaughey is a 58 year old female with a history of hypertension, prediabetes, hyperlipidemia who presents today for follow-up of hypertension.  She was last evaluated on 05/04/2018 with reports of elevated blood pressure readings for the past 1 month.  She attempted to resume her amlodipine but could not tolerate due to side effects of headaches.  She had been monitoring her blood pressure infrequently at local pharmacies with elevated readings while taking losartan 25 mg.  At that appointment we discussed for her to purchase her own cuff and monitor blood pressure consistently.  We kept her on losartan 25 mg daily.  Since her last visit she's been checking her BP at home which is running 130's-150's/60's-70's. She denies dizziness, headaches, chest pain. She is compliant to her losartan 25 mg daily.   BP Readings from Last 3 Encounters:  05/18/18 140/90  11/08/17 130/80  09/13/17 (!) 166/81     Review of Systems  Eyes: Negative for visual disturbance.  Respiratory: Negative for shortness of breath.   Cardiovascular: Negative for chest pain.  Neurological: Negative for dizziness and headaches.       Past Medical History:  Diagnosis Date  . Arthritis   . Carotid bruit    right   . Chicken pox   . Hypertension   . Migraine   . Other ill-defined heart disease      Social History   Socioeconomic History  . Marital status: Married    Spouse name: Not on file  . Number of children: Not on file  . Years of education: Not on file  . Highest education level: Not on file  Occupational History  . Not on file  Social Needs  . Financial resource strain: Not on file  . Food insecurity:    Worry: Not on file    Inability: Not on file  . Transportation needs:    Medical: Not on file    Non-medical: Not on file  Tobacco Use  . Smoking status: Never Smoker  . Smokeless  tobacco: Never Used  Substance and Sexual Activity  . Alcohol use: Yes    Alcohol/week: 1.0 standard drinks    Types: 1 Standard drinks or equivalent per week  . Drug use: No  . Sexual activity: Not on file  Lifestyle  . Physical activity:    Days per week: Not on file    Minutes per session: Not on file  . Stress: Not on file  Relationships  . Social connections:    Talks on phone: Not on file    Gets together: Not on file    Attends religious service: Not on file    Active member of club or organization: Not on file    Attends meetings of clubs or organizations: Not on file    Relationship status: Not on file  . Intimate partner violence:    Fear of current or ex partner: Not on file    Emotionally abused: Not on file    Physically abused: Not on file    Forced sexual activity: Not on file  Other Topics Concern  . Not on file  Social History Narrative   Married.   3 children   Highest level of education 12th grade.   Works as a Arts development officer.   Enjoys playing on the computer, walking, organizing.    Past Surgical History:  Procedure Laterality Date  . BREAST CYST ASPIRATION     at age 58, possibly left breast  . CESAREAN SECTION    . TUBAL LIGATION      Family History  Problem Relation Age of Onset  . Heart attack Mother   . Hypertension Mother     No Known Allergies  Current Outpatient Medications on File Prior to Visit  Medication Sig Dispense Refill  . losartan (COZAAR) 25 MG tablet Take 1 tablet (25 mg total) by mouth daily. For blood pressure. 30 tablet 0   No current facility-administered medications on file prior to visit.     BP 140/90   Pulse 80   Temp 98.2 F (36.8 C) (Oral)   Ht 5\' 6"  (1.676 m)   Wt 220 lb 8 oz (100 kg)   SpO2 97%   BMI 35.59 kg/m    Objective:   Physical Exam  Constitutional: She appears well-nourished.  Neck: Neck supple.  Cardiovascular: Normal rate and regular rhythm.  Respiratory: Effort normal and breath sounds  normal.  Skin: Skin is warm and dry.           Assessment & Plan:

## 2018-05-18 NOTE — Assessment & Plan Note (Signed)
Repeat A1C pending. Encouraged weight loss through healthy diet and exercise.

## 2018-05-18 NOTE — Patient Instructions (Signed)
Stop by the lab prior to leaving today. I will notify you of your results once received.   We've increased your dose of losartan to 50 mg daily. Take 1 tablet by mouth once daily for blood pressure.   Start monitoring your blood pressure daily, around the same time of day, for the next 2-3 weeks.  Ensure that you have rested for 30 minutes prior to checking your blood pressure. Record your readings and call us with readings in 2 weeks.   Your blood pressure goal is less than 135/90.  It was a pleasure to see you today!

## 2018-05-18 NOTE — Assessment & Plan Note (Signed)
Above goal in the office today on losartan 25 mg, also above goal with home readings. Increase losartan to 50 mg daily. We will have her continue to monitor her BP and send [place] some readings in two weeks.

## 2018-06-16 ENCOUNTER — Other Ambulatory Visit: Payer: Self-pay | Admitting: Primary Care

## 2018-06-16 DIAGNOSIS — I10 Essential (primary) hypertension: Secondary | ICD-10-CM

## 2018-06-16 NOTE — Telephone Encounter (Signed)
Will you please call patient and ask for some home BP readings? She also needs a nurse visit for BP check.

## 2018-06-16 NOTE — Telephone Encounter (Signed)
Message left for patient to return my call.  

## 2018-06-16 NOTE — Telephone Encounter (Signed)
Last prescribed on 05/18/2018 #30 with 0 refills. Last office visit on 05/18/2018. No future appointment

## 2018-06-20 NOTE — Telephone Encounter (Signed)
Pt returned your call her bp readings  Nurse visit 5/13 @ 9:30  4/21    132/76 4/22     131/84 4/23     129/71 4/24     128/75 4/25     130/78 4/26     126/75 4/27      131/82 4/28      127/76 4/29      130/76 4/30      129/86 5/1         138/71 5/2        Didn't take 5/3        131/79 5/4         135/76 5/5          134/72 5/6           138/86 5/7           132/87 5/8            132/87 5/9          126/75 5/10        131/82

## 2018-06-20 NOTE — Telephone Encounter (Signed)
Message left for patient to return my call.  

## 2018-06-20 NOTE — Telephone Encounter (Signed)
Reviewed. Stable on losartan 50 mg, continue same.

## 2018-06-22 ENCOUNTER — Ambulatory Visit: Payer: BLUE CROSS/BLUE SHIELD | Admitting: *Deleted

## 2018-06-22 VITALS — BP 136/86 | HR 86

## 2018-06-22 DIAGNOSIS — I10 Essential (primary) hypertension: Secondary | ICD-10-CM

## 2018-06-22 NOTE — Progress Notes (Signed)
Per [name] encounter order on 05/18/2018, patient presents today for a nurse visit blood pressure check for ongoing follow up and management.  Vital Sign Readings today BP 136/86.

## 2018-09-16 ENCOUNTER — Other Ambulatory Visit: Payer: Self-pay

## 2018-09-16 DIAGNOSIS — Z20822 Contact with and (suspected) exposure to covid-19: Secondary | ICD-10-CM

## 2018-09-17 LAB — NOVEL CORONAVIRUS, NAA: SARS-CoV-2, NAA: NOT DETECTED

## 2018-09-26 ENCOUNTER — Other Ambulatory Visit: Payer: Self-pay | Admitting: Primary Care

## 2018-09-26 DIAGNOSIS — I10 Essential (primary) hypertension: Secondary | ICD-10-CM

## 2018-11-17 ENCOUNTER — Other Ambulatory Visit: Payer: Self-pay | Admitting: Primary Care

## 2018-11-17 DIAGNOSIS — Z1231 Encounter for screening mammogram for malignant neoplasm of breast: Secondary | ICD-10-CM

## 2018-12-23 ENCOUNTER — Ambulatory Visit
Admission: RE | Admit: 2018-12-23 | Discharge: 2018-12-23 | Disposition: A | Discharge status: Home / Self Care | Payer: BC Managed Care – PPO | Source: Ambulatory Visit | Attending: Primary Care | Admitting: Primary Care

## 2018-12-23 DIAGNOSIS — Z1231 Encounter for screening mammogram for malignant neoplasm of breast: Secondary | ICD-10-CM | POA: Diagnosis present

## 2019-05-08 ENCOUNTER — Telehealth: Payer: Self-pay

## 2019-05-08 NOTE — Telephone Encounter (Signed)
Noted, will evaluate. 

## 2019-05-08 NOTE — Telephone Encounter (Signed)
[facility] Day - Client TELEPHONE ADVICE RECORD [facility] Patient Name: Tara Marshall Gender: Female DOB: 01/08/1961 Age: 59 Y 27 D Return Phone Number: [phone] (Primary) Address: City/State/Zip: [facility] [place] [address] Client [facility] - Client Client Site [facility] - Day Physician Vernona Rieger - NP Contact Type Call Who Is Calling Patient / Member / Family / Caregiver Call Type Triage / Clinical Relationship To Patient Self Return Phone Number [phone] (Primary) Chief Complaint Dizziness Reason for Call Symptomatic / Request for Health Information Initial Comment caller states this weekend she started having light headedness / pressure on the left side of her head. Translation No Nurse Assessment Nurse: Robb Matar, RN, Lauren Date/Time Lamount Cohen Time): 05/08/2019 12:18:44 PM Confirm and document reason for call. If symptomatic, describe symptoms. ---Caller states this weekend she started having light headedness / pressure on the left side of her head. States on Saturday she got up and took a shower and went out, got something to eat, then came home and took a nap. When she got up she kept feeling dizzy and lightheaded- thought she was going to fall. Lingered on Sunday and had a slightly weak feeling. States she believes her potassium has been low before and wants to make sure there's not something going on. Feeling groggy now. Drove to the grocery store this morning and felt okay. Feel a heaviness like she doesn't want to get up. States her head has been hurting a lot lately and this happens around this time of year every year. Is taking nose spray to help allergies and sinuses. Denies fever or any other symptoms. Lightheadedness worse with bending over. Ate a cheeseburger today. Drinking fluids. Has the patient had close contact with a person known or suspected to have the novel  coronavirus illness OR traveled / lives in area with major community spread (including international travel) in the last 14 days from the onset of symptoms? * If Asymptomatic, screen for exposure and travel within the last 14 days. ---No Does the patient have any new or worsening symptoms? ---Yes Will a triage be completed? ---Yes Related visit to physician within the last 2 weeks? ---No Does the PT have any chronic conditions? (i.e. diabetes, asthma, this includes High risk factors for pregnancy, etc.) ---Yes List chronic conditions. ---HTN Is this a behavioral health or substance abuse call? ---No PLEASE NOTE: All timestamps contained within this report are represented as [place] Standard Time. CONFIDENTIALTY NOTICE: This fax transmission is intended only for the addressee. It contains information that is legally privileged, confidential or otherwise protected from use or disclosure. If you are not the intended recipient, you are strictly prohibited from reviewing, disclosing, copying using or disseminating any of this information or taking any action in reliance on or regarding this information. If you have received this fax in error, please notify us immediately by telephone so that we can arrange for its return to [place]. Phone: [phone], Toll-Free: [phone], Fax: [phone] Page: 2 of 2 Call Id: [id number] Guidelines Guideline Title Affirmed Question Affirmed Notes Nurse Date/Time Lamount Cohen Time) Dizziness - Lightheadedness [1] MODERATE dizziness (e.g., interferes with normal activities) AND [2] has NOT been evaluated by physician for this (Exception: dizziness caused by heat exposure, sudden standing, or poor fluid intake) Robb Matar, RN, Lauren 05/08/2019 12:22:39 PM Disp. Time Lamount Cohen Time) Disposition Final User 05/08/2019 12:29:04 PM See PCP within 24 Hours Yes Robb Matar, RN, Audrea Muscat Disagree/Comply Comply Caller Understands Yes PreDisposition Go to  ED Care Advice  Given Per Guideline SEE PCP WITHIN 24 HOURS: * IF OFFICE WILL BE OPEN: You need to be seen within the next 24 hours. Call your doctor (or NP/PA) when the office opens and make an appointment. DRINK FLUIDS: * Drink several glasses of fruit juice, other clear fluids or water. * This will improve hydration and blood glucose. * If the weather is hot or you have a fever, make sure the fluids are cold. LIE DOWN AND REST: * Lie down with feet elevated for 1 hour. * This will improve circulation and increase blood flow to the brain. CALL BACK IF: * You become worse. * Passes out (faints) CARE ADVICE given per Dizziness (Adult) guideline. Comments User: Andee Poles, RN Date/Time Eilene Ghazi Time): 05/08/2019 12:24:01 PM States she is getting headaches and can't sleep when she takes her Losartan- Has been taking them for a while Referrals Warm transfer to [place] PCP OFFICE

## 2019-05-08 NOTE — Telephone Encounter (Signed)
Pt already has in office appt to see Allayne Gitelman NP on 05/09/19 at 7:20 AM. Lorain Childes to Allayne Gitelman NP.

## 2019-05-09 ENCOUNTER — Other Ambulatory Visit: Payer: Self-pay

## 2019-05-09 ENCOUNTER — Encounter: Payer: Self-pay | Admitting: Primary Care

## 2019-05-09 ENCOUNTER — Ambulatory Visit (INDEPENDENT_AMBULATORY_CARE_PROVIDER_SITE_OTHER): Payer: BC Managed Care – PPO | Admitting: Primary Care

## 2019-05-09 VITALS — BP 140/90 | HR 82 | Temp 96.8°F | Ht 66.0 in | Wt 229.5 lb

## 2019-05-09 DIAGNOSIS — R42 Dizziness and giddiness: Secondary | ICD-10-CM | POA: Insufficient documentation

## 2019-05-09 DIAGNOSIS — I10 Essential (primary) hypertension: Secondary | ICD-10-CM

## 2019-05-09 DIAGNOSIS — R519 Headache, unspecified: Secondary | ICD-10-CM | POA: Diagnosis not present

## 2019-05-09 HISTORY — DX: Dizziness and giddiness: R42

## 2019-05-09 MED ORDER — HYDROCHLOROTHIAZIDE 12.5 MG PO TABS: 12.5000 mg | ORAL_TABLET | Freq: Every day | ORAL | 0 refills | Status: DC

## 2019-05-09 NOTE — Assessment & Plan Note (Signed)
Acute for the last three days, improved with Flonase. Will work on better BP control.  Switch to HCTZ from losartan which may help for BPPV if this is the cause.   She is feeling better which is a good sign. Neuro exam today unremarkable. Strict return precautions provided. Follow up in 2 weeks.

## 2019-05-09 NOTE — Assessment & Plan Note (Signed)
Unclear if headache is secondary to side effects of losartan, uncontrolled BP, or seasonal changes.  Start by discontinuing losartan, switch to HCTZ 12.5 mg since she felt better on this medication. Continue Flonase and Allegra.   Neuro exam unremarkable. Consider CT head if needed. We will see her back in 2 weeks.

## 2019-05-09 NOTE — Patient Instructions (Signed)
Stop taking losartan for blood pressure.  Start taking hydrochlorothiazide 12.5 mg once daily for blood pressure.   Continue using Flonase and Allegra.  Please schedule a physical and follow up with me in 2 weeks.  It was a pleasure to see you today!

## 2019-05-09 NOTE — Assessment & Plan Note (Signed)
Above goal today, has not had losartan today. Unclear if headaches are from seasonal changes, side effects of losartan, or uncontrolled BP.  Will switch her back to HCTZ but at a lower dose of 12.5 mg. She did experience hypokalemia with the 25 mg dose.   We will plan to see her back in 2 weeks for follow up and BMP.
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
## 2019-05-19 ENCOUNTER — Other Ambulatory Visit: Payer: Self-pay | Admitting: Primary Care

## 2019-05-19 ENCOUNTER — Encounter: Payer: Self-pay | Admitting: Primary Care

## 2019-05-19 ENCOUNTER — Other Ambulatory Visit: Payer: Self-pay

## 2019-05-19 ENCOUNTER — Ambulatory Visit (INDEPENDENT_AMBULATORY_CARE_PROVIDER_SITE_OTHER): Payer: BC Managed Care – PPO | Admitting: Primary Care

## 2019-05-19 VITALS — BP 126/80 | HR 80 | Temp 96.7°F | Ht 66.0 in | Wt 230.0 lb

## 2019-05-19 DIAGNOSIS — R7303 Prediabetes: Secondary | ICD-10-CM | POA: Diagnosis not present

## 2019-05-19 DIAGNOSIS — E785 Hyperlipidemia, unspecified: Secondary | ICD-10-CM | POA: Diagnosis not present

## 2019-05-19 DIAGNOSIS — R42 Dizziness and giddiness: Secondary | ICD-10-CM

## 2019-05-19 DIAGNOSIS — I10 Essential (primary) hypertension: Secondary | ICD-10-CM

## 2019-05-19 DIAGNOSIS — Z1159 Encounter for screening for other viral diseases: Secondary | ICD-10-CM

## 2019-05-19 DIAGNOSIS — R519 Headache, unspecified: Secondary | ICD-10-CM

## 2019-05-19 DIAGNOSIS — Z0000 Encounter for general adult medical examination without abnormal findings: Secondary | ICD-10-CM | POA: Diagnosis not present

## 2019-05-19 LAB — COMPREHENSIVE METABOLIC PANEL
ALT: 16 U/L (ref 0–35)
AST: 15 U/L (ref 0–37)
Albumin: 4.4 g/dL (ref 3.5–5.2)
Alkaline Phosphatase: 81 U/L (ref 39–117)
BUN: 12 mg/dL (ref 6–23)
CO2: 32 mEq/L (ref 19–32)
Calcium: 9.5 mg/dL (ref 8.4–10.5)
Chloride: 101 mEq/L (ref 96–112)
Creatinine, Ser: 0.81 mg/dL (ref 0.40–1.20)
GFR: 86.95 mL/min (ref >60.00)
Glucose, Bld: 97 mg/dL (ref 70–99)
Potassium: 3.4 mEq/L — ABNORMAL LOW (ref 3.5–5.1)
Sodium: 142 mEq/L (ref 135–145)
Total Bilirubin: 0.7 mg/dL (ref 0.2–1.2)
Total Protein: 7.1 g/dL (ref 6.0–8.3)

## 2019-05-19 LAB — CBC
HCT: 38.1 % (ref 36.0–46.0)
Hemoglobin: 12.6 g/dL (ref 12.0–15.0)
MCHC: 33.1 g/dL (ref 30.0–36.0)
MCV: 85.9 fl (ref 78.0–100.0)
Platelets: 238 10*3/uL (ref 150.0–400.0)
RBC: 4.44 Mil/uL (ref 3.87–5.11)
RDW: 14 % (ref 11.5–15.5)
WBC: 7 10*3/uL (ref 4.0–10.5)

## 2019-05-19 LAB — LIPID PANEL
Cholesterol: 212 mg/dL — ABNORMAL HIGH (ref 0–200)
HDL: 64 mg/dL (ref >39.00)
LDL Cholesterol: 131 mg/dL — ABNORMAL HIGH (ref 0–99)
NonHDL: 148.39
Total CHOL/HDL Ratio: 3
Triglycerides: 85 mg/dL (ref 0.0–149.0)
VLDL: 17 mg/dL (ref 0.0–40.0)

## 2019-05-19 LAB — HEMOGLOBIN A1C: Hgb A1c MFr Bld: 5.9 % (ref 4.6–6.5)

## 2019-05-19 MED ORDER — HYDROCHLOROTHIAZIDE 12.5 MG PO TABS: 12.5000 mg | ORAL_TABLET | Freq: Every day | ORAL | 3 refills | Status: DC

## 2019-05-19 NOTE — Assessment & Plan Note (Signed)
Encouraged a healthy diet and exercise.  ? ?Repeat A1C pending. ?

## 2019-05-19 NOTE — Patient Instructions (Signed)
Stop by the lab prior to leaving today. I will notify you of your results once received.   Continue taking hydrochlorothiazide 12.5 mg once daily for blood pressure.  Continue exercising. You should be getting 150 minutes of moderate intensity exercise weekly.  Continue to work on a healthy diet. Ensure you are consuming 64 ounces of water daily.  It was a pleasure to see you today!   Preventive Care 68-61 Years Old, Female Preventive care refers to visits with your health care provider and lifestyle choices that can promote health and wellness. This includes:  A yearly physical exam. This may also be called an annual well check.  Regular dental visits and eye exams.  Immunizations.  Screening for certain conditions.  Healthy lifestyle choices, such as eating a healthy diet, getting regular exercise, not using drugs or products that contain nicotine and tobacco, and limiting alcohol use. What can I expect for my preventive care visit? Physical exam Your health care provider will check your:  Height and weight. This may be used to calculate body mass index (BMI), which tells if you are at a healthy weight.  Heart rate and blood pressure.  Skin for abnormal spots. Counseling Your health care provider may ask you questions about your:  Alcohol, tobacco, and drug use.  Emotional well-being.  Home and relationship well-being.  Sexual activity.  Eating habits.  Work and work Statistician.  Method of birth control.  Menstrual cycle.  Pregnancy history. What immunizations do I need?  Influenza (flu) vaccine  This is recommended every year. Tetanus, diphtheria, and pertussis (Tdap) vaccine  You may need a Td booster every 10 years. Varicella (chickenpox) vaccine  You may need this if you have not been vaccinated. Zoster (shingles) vaccine  You may need this after age 67. Measles, mumps, and rubella (MMR) vaccine  You may need at least one dose of MMR if you  were born in 1957 or later. You may also need a second dose. Pneumococcal conjugate (PCV13) vaccine  You may need this if you have certain conditions and were not previously vaccinated. Pneumococcal polysaccharide (PPSV23) vaccine  You may need one or two doses if you smoke cigarettes or if you have certain conditions. Meningococcal conjugate (MenACWY) vaccine  You may need this if you have certain conditions. Hepatitis A vaccine  You may need this if you have certain conditions or if you travel or work in places where you may be exposed to hepatitis A. Hepatitis B vaccine  You may need this if you have certain conditions or if you travel or work in places where you may be exposed to hepatitis B. Haemophilus influenzae type b (Hib) vaccine  You may need this if you have certain conditions. Human papillomavirus (HPV) vaccine  If recommended by your health care provider, you may need three doses over 6 months. You may receive vaccines as individual doses or as more than one vaccine together in one shot (combination vaccines). Talk with your health care provider about the risks and benefits of combination vaccines. What tests do I need? Blood tests  Lipid and cholesterol levels. These may be checked every 5 years, or more frequently if you are over 69 years old.  Hepatitis C test.  Hepatitis B test. Screening  Lung cancer screening. You may have this screening every year starting at age 35 if you have a 30-pack-year history of smoking and currently smoke or have quit within the past 15 years.  Colorectal cancer screening. All adults  should have this screening starting at age 38 and continuing until age 81. Your health care provider may recommend screening at age 72 if you are at increased risk. You will have tests every 1-10 years, depending on your results and the type of screening test.  Diabetes screening. This is done by checking your blood sugar (glucose) after you have not  eaten for a while (fasting). You may have this done every 1-3 years.  Mammogram. This may be done every 1-2 years. Talk with your health care provider about when you should start having regular mammograms. This may depend on whether you have a family history of breast cancer.  BRCA-related cancer screening. This may be done if you have a family history of breast, ovarian, tubal, or peritoneal cancers.  Pelvic exam and Pap test. This may be done every 3 years starting at age 80. Starting at age 11, this may be done every 5 years if you have a Pap test in combination with an HPV test. Other tests  Sexually transmitted disease (STD) testing.  Bone density scan. This is done to screen for osteoporosis. You may have this scan if you are at high risk for osteoporosis. Follow these instructions at home: Eating and drinking  Eat a diet that includes fresh fruits and vegetables, whole grains, lean protein, and low-fat dairy.  Take vitamin and mineral supplements as recommended by your health care provider.  Do not drink alcohol if: ? Your health care provider tells you not to drink. ? You are pregnant, may be pregnant, or are planning to become pregnant.  If you drink alcohol: ? Limit how much you have to 0-1 drink a day. ? Be aware of how much alcohol is in your drink. In the [place] one drink equals one 12 oz bottle of beer (355 mL), one 5 oz glass of wine (148 mL), or one 1 oz glass of hard liquor (44 mL). Lifestyle  Take daily care of your teeth and gums.  Stay active. Exercise for at least 30 minutes on 5 or more days each week.  Do not use any products that contain nicotine or tobacco, such as cigarettes, e-cigarettes, and chewing tobacco. If you need help quitting, ask your health care provider.  If you are sexually active, practice safe sex. Use a condom or other form of birth control (contraception) in order to prevent pregnancy and STIs (sexually transmitted infections).  If told  by your health care provider, take low-dose aspirin daily starting at age 42. What's next?  Visit your health care provider once a year for a well check visit.  Ask your health care provider how often you should have your eyes and teeth checked.  Stay up to date on all vaccines. This information is not intended to replace advice given to you by your health care provider. Make sure you discuss any questions you have with your health care provider. Document Revised: 10/07/2017 Document Reviewed: 10/07/2017 Elsevier Patient Education  2020 [employer].

## 2019-05-19 NOTE — Assessment & Plan Note (Signed)
Repeat lipid panel pending. 

## 2019-05-19 NOTE — Assessment & Plan Note (Signed)
Tetanus UTD, declines all other vaccines. Pap smear UTD, due in 2022. Mammogram UTD. Colonoscopy UTD, due in 2026. Encouraged a healthy diet, regular exercise. Exam today unremarkable. Labs pending.

## 2019-05-19 NOTE — Progress Notes (Signed)
Subjective:    Patient ID: Tara Marshall, female    DOB: 1960-08-26, 59 y.o.   MRN: [id number]  HPI  This visit occurred during the SARS-CoV-2 public health emergency.  Safety protocols were in place, including screening questions prior to the visit, additional usage of staff PPE, and extensive cleaning of exam room while observing appropriate contact time as indicated for disinfecting solutions.   Tara Marshall isa  59 year old female who presents today for follow up of hypertension and complete physical.  She was last evaluated on 05/09/19 for symptoms of lightheadedness/pressure, allergy symptoms, daily headaches within one hour after taking losartan. Home BP was ranging 130's-140's off losartan. During her last visit we stopped her losartan and initiated HCTZ.   Since her last visit she's feeling better. Her lightheadedness and headaches have dissipated.    Immunizations: -Tetanus: Completed in 2012 -Influenza: Did not receive -Shingles: Declines  Diet: She endorses a healthy diet. Exercise: She is walking 5 days weekly   Eye exam: Completed in 2020 Dental exam: Completes semi-annually   Pap Smear: Completed in 2019 Mammogram: Completed in Fall 2020 Colonoscopy: Completed in 2016, due in 202 Hep C Screen: Due today   BP Readings from Last 3 Encounters:  05/19/19 126/80  05/09/19 140/90  06/22/18 136/86     Review of Systems  Constitutional: Negative for unexpected weight change.  HENT: Negative for rhinorrhea.   Respiratory: Negative for cough and shortness of breath.   Cardiovascular: Negative for chest pain.  Gastrointestinal: Negative for constipation and diarrhea.  Genitourinary: Negative for difficulty urinating.  Musculoskeletal: Negative for arthralgias.  Skin: Negative for rash.  Allergic/Immunologic: Positive for environmental allergies.  Neurological: Negative for dizziness and headaches.  Psychiatric/Behavioral: The patient is not nervous/anxious.         Past Medical History:  Diagnosis Date  . Arthritis   . Carotid bruit    right   . Chicken pox   . Hypertension   . Migraine   . Other ill-defined heart disease      Social History   Socioeconomic History  . Marital status: Married    Spouse name: Not on file  . Number of children: Not on file  . Years of education: Not on file  . Highest education level: Not on file  Occupational History  . Not on file  Tobacco Use  . Smoking status: Never Smoker  . Smokeless tobacco: Never Used  Substance and Sexual Activity  . Alcohol use: Yes    Alcohol/week: 1.0 standard drinks    Types: 1 Standard drinks or equivalent per week  . Drug use: No  . Sexual activity: Not on file  Other Topics Concern  . Not on file  Social History Narrative   Married.   3 children   Highest level of education 12th grade.   Works as a Materials engineer.   Enjoys playing on the computer, walking, organizing.   Social Determinants of Health   Financial Resource Strain:   . Difficulty of Paying Living Expenses:   Food Insecurity:   . Worried About Charity fundraiser in the Last Year:   . Arboriculturist in the Last Year:   Transportation Needs:   . Film/video editor (Medical):   Marland Kitchen Lack of Transportation (Non-Medical):   Physical Activity:   . Days of Exercise per Week:   . Minutes of Exercise per Session:   Stress:   . Feeling of Stress :  Social Connections:   . Frequency of Communication with Friends and Family:   . Frequency of Social Gatherings with Friends and Family:   . Attends Religious Services:   . Active Member of Clubs or Organizations:   . Attends Banker Meetings:   Marland Kitchen Marital Status:   Intimate Partner Violence:   . Fear of Current or Ex-Partner:   . Emotionally Abused:   Marland Kitchen Physically Abused:   . Sexually Abused:     Past Surgical History:  Procedure Laterality Date  . BREAST CYST ASPIRATION     at age 70, possibly left breast  . CESAREAN SECTION     . TUBAL LIGATION      Family History  Problem Relation Age of Onset  . Heart attack Mother   . Hypertension Mother     No Known Allergies  Current Outpatient Medications on File Prior to Visit  Medication Sig Dispense Refill  . hydrochlorothiazide (HYDRODIURIL) 12.5 MG tablet Take 1 tablet (12.5 mg total) by mouth daily. For blood pressure. 30 tablet 0   No current facility-administered medications on file prior to visit.    BP 126/80   Pulse 80   Temp (!) 96.7 F (35.9 C) (Temporal)   Ht 5\' 6"  (1.676 m)   Wt 230 lb (104.3 kg)   SpO2 98%   BMI 37.12 kg/m    Objective:   Physical Exam  Constitutional: She is oriented to person, place, and time. She appears well-nourished.  HENT:  Right Ear: Tympanic membrane and ear canal normal.  Left Ear: Tympanic membrane and ear canal normal.  Mouth/Throat: Oropharynx is clear and moist.  Eyes: Pupils are equal, round, and reactive to light. EOM are normal.  Cardiovascular: Normal rate and regular rhythm.  Respiratory: Effort normal and breath sounds normal.  GI: Soft. Bowel sounds are normal. There is no abdominal tenderness.  Musculoskeletal:        General: Normal range of motion.     Cervical back: Neck supple.  Neurological: She is alert and oriented to person, place, and time. No cranial nerve deficit.  Reflex Scores:      Patellar reflexes are 2+ on the right side and 2+ on the left side. Skin: Skin is warm and dry.  Psychiatric: She has a normal mood and affect.           Assessment & Plan:

## 2019-05-19 NOTE — Assessment & Plan Note (Signed)
Resolved with change to HCTZ from Losartan.  BP well controlled.

## 2019-05-19 NOTE — Assessment & Plan Note (Signed)
Well controlled on HCTZ 12.5 mg, headaches and lightheadedness have resolved.  BMP pending. Will needs refills once BMP returns.

## 2019-05-19 NOTE — Assessment & Plan Note (Signed)
Resolved. BP well controlled on HCTZ. BMP pending.

## 2019-05-22 LAB — HEPATITIS C ANTIBODY
Hepatitis C Ab: NONREACTIVE
SIGNAL TO CUT-OFF: 0.01 (ref <1.00)

## 2019-11-27 ENCOUNTER — Other Ambulatory Visit: Payer: Self-pay | Admitting: Internal Medicine

## 2019-11-27 ENCOUNTER — Other Ambulatory Visit: Payer: Self-pay | Admitting: Primary Care

## 2019-11-27 DIAGNOSIS — Z1231 Encounter for screening mammogram for malignant neoplasm of breast: Secondary | ICD-10-CM

## 2019-12-15 ENCOUNTER — Other Ambulatory Visit: Payer: Self-pay

## 2019-12-15 ENCOUNTER — Encounter: Payer: Self-pay | Admitting: Primary Care

## 2019-12-15 ENCOUNTER — Telehealth (INDEPENDENT_AMBULATORY_CARE_PROVIDER_SITE_OTHER): Payer: BC Managed Care – PPO | Admitting: Primary Care

## 2019-12-15 VITALS — Ht 66.0 in | Wt 217.0 lb

## 2019-12-15 DIAGNOSIS — J302 Other seasonal allergic rhinitis: Secondary | ICD-10-CM | POA: Insufficient documentation

## 2019-12-15 MED ORDER — FLUTICASONE PROPIONATE 50 MCG/ACT NA SUSP: 1.0000 | Freq: Two times a day (BID) | NASAL | 0 refills | Status: AC

## 2019-12-15 MED ORDER — PREDNISONE 20 MG PO TABS: ORAL_TABLET | ORAL | 0 refills | Status: DC

## 2019-12-15 MED ORDER — CETIRIZINE HCL 10 MG PO TABS: 10.0000 mg | ORAL_TABLET | Freq: Every day | ORAL | 0 refills | Status: AC

## 2019-12-15 NOTE — Patient Instructions (Signed)
Start prednisone tablets for nasal congestion. Take 2 tablets by mouth once daily for five days.  Start Zyrtec 10 mg at bedtime for allergies.  Start Flonase nasal spray once you finish the prednisone. Use 1 spray in each nostril twice daily, everyday.  Please update me in 1 week if no improvement.  It was a pleasure to see you today! Mayra Reel, NP-C

## 2019-12-15 NOTE — Progress Notes (Signed)
Subjective:    Patient ID: Tara Marshall, female    DOB: 15-Dec-1960, 59 y.o.   MRN: [id number]  HPI  Virtual Visit via Video Note  I connected with Tara Marshall on 12/15/19 at 10:00 AM EDT by a video enabled telemedicine application and verified that I am speaking with the correct person using two identifiers.  Location: Patient: Home Provider: Office Participants: Patient and myself   I discussed the limitations of evaluation and management by telemedicine and the availability of in person appointments. The patient expressed understanding and agreed to proceed.  History of Present Illness:  Tara Marshall is a 59 year old female with a history of hypertension, prediabetes, hyperlipidemia, lightheadedness, headaches who presents today with a chief complaint of nasal congestion.  She also reports mid frontal headache, post nasal drip, increased mucous production. She's blowing thick clear muscous from her nasal cavity. Symptoms began in September and have been intermittent since. She typically gets allergy symptoms every year.   She's been using Flonase, sinus decongestant pills with temporary improvement. She denies fevers, sinus pressure, body aches, fatigue, cough. Overall she's feeling well.    Observations/Objective:  Alert and oriented. Appears well, not sickly. No distress. Speaking in complete sentences. No cough.  Assessment and Plan:  Acute on chronic seasonal allergies intermittent. She does not appear sickly or in distress. Lower suspicion for bacterial process. Rx for prednisone course sent to pharmacy. Also start Zyrtec HS, then start Flonase daily after prednisone completion.   Follow Up Instructions:  Start prednisone tablets for nasal congestion. Take 2 tablets by mouth once daily for five days.  Start Zyrtec 10 mg at bedtime for allergies.  Start Flonase nasal spray once you finish the prednisone. Use 1 spray in each nostril twice daily,  everyday.  Please update me in 1 week if no improvement.  It was a pleasure to see you today! Mayra Reel, NP-C    I discussed the assessment and treatment plan with the patient. The patient was provided an opportunity to ask questions and all were answered. The patient agreed with the plan and demonstrated an understanding of the instructions.   The patient was advised to call back or seek an in-person evaluation if the symptoms worsen or if the condition fails to improve as anticipated.    Doreene Nest, NP    Review of Systems  Constitutional: Negative for chills, fatigue and fever.  HENT: Positive for congestion and postnasal drip. Negative for sinus pressure, sinus pain and sore throat.   Respiratory: Negative for cough.   Cardiovascular: Negative for chest pain.  Allergic/Immunologic: Positive for environmental allergies.       Past Medical History:  Diagnosis Date   Arthritis    Carotid bruit    right    Chicken pox    Hypertension    Migraine    Other ill-defined heart disease      Social History   Socioeconomic History   Marital status: Married    Spouse name: Not on file   Number of children: Not on file   Years of education: Not on file   Highest education level: Not on file  Occupational History   Not on file  Tobacco Use   Smoking status: Never Smoker   Smokeless tobacco: Never Used  Substance and Sexual Activity   Alcohol use: Yes    Alcohol/week: 1.0 standard drink    Types: 1 Standard drinks or equivalent per week   Drug use:  No   Sexual activity: Not on file  Other Topics Concern   Not on file  Social History Narrative   Married.   3 children   Highest level of education 12th grade.   Works as a Arts development officer.   Enjoys playing on the computer, walking, organizing.   Social Determinants of Health   Financial Resource Strain:    Difficulty of Paying Living Expenses: Not on file  Food Insecurity:    Worried About  Programme researcher, broadcasting/film/video in the Last Year: Not on file   [employer] of Food in the Last Year: Not on file  Transportation Needs:    Lack of Transportation (Medical): Not on file   Lack of Transportation (Non-Medical): Not on file  Physical Activity:    Days of Exercise per Week: Not on file   Minutes of Exercise per Session: Not on file  Stress:    Feeling of Stress : Not on file  Social Connections:    Frequency of Communication with Friends and Family: Not on file   Frequency of Social Gatherings with Friends and Family: Not on file   Attends Religious Services: Not on file   Active Member of Clubs or Organizations: Not on file   Attends Banker Meetings: Not on file   Marital Status: Not on file  Intimate Partner Violence:    Fear of Current or Ex-Partner: Not on file   Emotionally Abused: Not on file   Physically Abused: Not on file   Sexually Abused: Not on file    Past Surgical History:  Procedure Laterality Date   BREAST CYST ASPIRATION     at age 39, possibly left breast   CESAREAN SECTION     TUBAL LIGATION      Family History  Problem Relation Age of Onset   Heart attack Mother    Hypertension Mother     No Known Allergies  Current Outpatient Medications on File Prior to Visit  Medication Sig Dispense Refill   hydrochlorothiazide (HYDRODIURIL) 12.5 MG tablet Take 1 tablet (12.5 mg total) by mouth daily. For blood pressure. 90 tablet 3   No current facility-administered medications on file prior to visit.    Ht 5\' 6"  (1.676 m)    Wt 217 lb (98.4 kg)    BMI 35.02 kg/m    Objective:   Physical Exam Constitutional:      Appearance: She is not ill-appearing.  Pulmonary:     Effort: Pulmonary effort is normal.     Comments: No cough during visit Neurological:     Mental Status: She is alert and oriented to person, place, and time.            Assessment & Plan:

## 2019-12-15 NOTE — Assessment & Plan Note (Signed)
Acute on chronic seasonal allergies intermittent. She does not appear sickly or in distress. Lower suspicion for bacterial process. Rx for prednisone course sent to pharmacy. Also start Zyrtec HS, then start Flonase daily after prednisone completion.

## 2019-12-22 ENCOUNTER — Telehealth: Payer: Self-pay | Admitting: *Deleted

## 2019-12-22 NOTE — Telephone Encounter (Signed)
Please thank patient for the update, I am glad to know that she is doing better. The symptoms could be a side effect of prednisone, but I would like to check on her Monday 11/15 to ensure her symptoms have resolved.

## 2019-12-22 NOTE — Telephone Encounter (Signed)
Patient called stating that she wanted to let Mayra Reel NP know what is going on with her. Patient stated that she was given medications for her allergies. Patient stated that she has gotten better and her nose is not running as much. Patient stated that she finished the prednisone 2 days ago. Patient stated that she has had some funny feelings since finishing the medication. Patient stated yesterday and today she felt a little quezzy and lightheaded a couple of times. Patient stated that this has happened twice and only lasted a few seconds. Patient was wondering if this could be a side of effect of the medication. Patient denies any SOB or difficulty breathing.

## 2019-12-25 ENCOUNTER — Ambulatory Visit
Admission: RE | Admit: 2019-12-25 | Discharge: 2019-12-25 | Disposition: A | Discharge status: Home / Self Care | Payer: BC Managed Care – PPO | Source: Ambulatory Visit | Attending: Primary Care | Admitting: Primary Care

## 2019-12-25 ENCOUNTER — Other Ambulatory Visit: Payer: Self-pay

## 2019-12-25 DIAGNOSIS — Z1231 Encounter for screening mammogram for malignant neoplasm of breast: Secondary | ICD-10-CM | POA: Insufficient documentation

## 2019-12-25 NOTE — Telephone Encounter (Signed)
Returning your call. °

## 2019-12-25 NOTE — Telephone Encounter (Signed)
Left message to return call to our office.  

## 2019-12-25 NOTE — Telephone Encounter (Signed)
Pt called in returning your call.

## 2019-12-26 NOTE — Telephone Encounter (Signed)
Noted. Glad to hear

## 2019-12-26 NOTE — Telephone Encounter (Signed)
Was able to reach patient. She is feeling much better now. Only mild headache but even that is improved.

## 2020-01-08 ENCOUNTER — Telehealth: Payer: Self-pay

## 2020-01-08 NOTE — Telephone Encounter (Addendum)
Heart rate readings and blood pressure are normal. If she is experiencing any palpations, chest pain, dizziness then I am happy to see her. Otherwise these are normal readings. She needs to remember to hydrate daily with water.

## 2020-01-08 NOTE — Addendum Note (Signed)
Addended by: Doreene Nest on: 01/08/2020 03:41 PM   Modules accepted: Orders

## 2020-01-08 NOTE — Telephone Encounter (Signed)
Called patient reviewed all information and repeated back to me. Will call if any questions.  She will increase fluid intake and keep eye on her HR. She will call if needs appointment at later time.

## 2020-01-08 NOTE — Telephone Encounter (Signed)
Pt lvm at triage.  States a pharmacist told her she should let her doctor know about the elevated HR.   Spoke with pt to get clarification of message.  Pt states she was at [facility] and checked her BP and HR.  BP was 125/75, however pulse was 98.  The pharmacist suggested pt let her doc know and monitor HR. Since then, pulse has been 91 and 89.  Pt is concerned since the pharmacists made mention to get checked.  Plz advise at [phone].

## 2020-01-08 NOTE — Telephone Encounter (Signed)
fyi

## 2020-06-14 ENCOUNTER — Encounter: Payer: BC Managed Care – PPO | Admitting: Primary Care

## 2020-06-18 ENCOUNTER — Other Ambulatory Visit: Payer: Self-pay | Admitting: Primary Care

## 2020-06-18 DIAGNOSIS — I10 Essential (primary) hypertension: Secondary | ICD-10-CM

## 2020-07-01 ENCOUNTER — Telehealth: Payer: Self-pay | Admitting: Primary Care

## 2020-07-02 ENCOUNTER — Ambulatory Visit (INDEPENDENT_AMBULATORY_CARE_PROVIDER_SITE_OTHER): Payer: BC Managed Care – PPO | Admitting: Primary Care

## 2020-07-02 ENCOUNTER — Encounter: Payer: Self-pay | Admitting: Primary Care

## 2020-07-02 ENCOUNTER — Other Ambulatory Visit: Payer: Self-pay

## 2020-07-02 VITALS — BP 136/82 | HR 75 | Temp 96.7°F | Ht 66.0 in | Wt 224.0 lb

## 2020-07-02 DIAGNOSIS — Z23 Encounter for immunization: Secondary | ICD-10-CM

## 2020-07-02 DIAGNOSIS — E785 Hyperlipidemia, unspecified: Secondary | ICD-10-CM

## 2020-07-02 DIAGNOSIS — R7303 Prediabetes: Secondary | ICD-10-CM | POA: Diagnosis not present

## 2020-07-02 DIAGNOSIS — Z0000 Encounter for general adult medical examination without abnormal findings: Secondary | ICD-10-CM

## 2020-07-02 DIAGNOSIS — Z114 Encounter for screening for human immunodeficiency virus [HIV]: Secondary | ICD-10-CM

## 2020-07-02 DIAGNOSIS — J302 Other seasonal allergic rhinitis: Secondary | ICD-10-CM

## 2020-07-02 DIAGNOSIS — I10 Essential (primary) hypertension: Secondary | ICD-10-CM | POA: Diagnosis not present

## 2020-07-02 LAB — COMPREHENSIVE METABOLIC PANEL
ALT: 18 U/L (ref 0–35)
AST: 15 U/L (ref 0–37)
Albumin: 4.3 g/dL (ref 3.5–5.2)
Alkaline Phosphatase: 62 U/L (ref 39–117)
BUN: 10 mg/dL (ref 6–23)
CO2: 31 mEq/L (ref 19–32)
Calcium: 9.3 mg/dL (ref 8.4–10.5)
Chloride: 104 mEq/L (ref 96–112)
Creatinine, Ser: 0.68 mg/dL (ref 0.40–1.20)
GFR: 93.47 mL/min (ref >60.00)
Glucose, Bld: 76 mg/dL (ref 70–99)
Potassium: 3.5 mEq/L (ref 3.5–5.1)
Sodium: 142 mEq/L (ref 135–145)
Total Bilirubin: 0.6 mg/dL (ref 0.2–1.2)
Total Protein: 7 g/dL (ref 6.0–8.3)

## 2020-07-02 LAB — CBC
HCT: 35.8 % — ABNORMAL LOW (ref 36.0–46.0)
Hemoglobin: 12 g/dL (ref 12.0–15.0)
MCHC: 33.4 g/dL (ref 30.0–36.0)
MCV: 84.2 fl (ref 78.0–100.0)
Platelets: 289 10*3/uL (ref 150.0–400.0)
RBC: 4.25 Mil/uL (ref 3.87–5.11)
RDW: 14.8 % (ref 11.5–15.5)
WBC: 6.8 10*3/uL (ref 4.0–10.5)

## 2020-07-02 LAB — LIPID PANEL
Cholesterol: 209 mg/dL — ABNORMAL HIGH (ref 0–200)
HDL: 60.8 mg/dL (ref >39.00)
LDL Cholesterol: 131 mg/dL — ABNORMAL HIGH (ref 0–99)
NonHDL: 147.76
Total CHOL/HDL Ratio: 3
Triglycerides: 84 mg/dL (ref 0.0–149.0)
VLDL: 16.8 mg/dL (ref 0.0–40.0)

## 2020-07-02 LAB — HEMOGLOBIN A1C: Hgb A1c MFr Bld: 6.1 % (ref 4.6–6.5)

## 2020-07-02 NOTE — Assessment & Plan Note (Signed)
Discussed the importance of a healthy diet and regular exercise in order for weight loss, and to reduce the risk of any potential medical problems.  Repeat lipid panel pending. 

## 2020-07-02 NOTE — Addendum Note (Signed)
Addended by: Donnamarie Poag on: 07/02/2020 09:32 AM   Modules accepted: Orders

## 2020-07-02 NOTE — Assessment & Plan Note (Signed)
Discussed the importance of a healthy diet and regular exercise in order for weight loss, and to reduce the risk of any potential medical problems.  Repeat A1C pending. 

## 2020-07-02 NOTE — Patient Instructions (Addendum)
Stop by the lab prior to leaving today. I will notify you of your results once received.   Start exercising. You should be getting 150 minutes of moderate intensity exercise weekly.  It's important to improve your diet by reducing consumption of fast food, fried food, processed snack foods, sugary drinks. Increase consumption of fresh vegetables and fruits, whole grains, water.  Ensure you are drinking 64 ounces of water daily.  It was a pleasure to see you today!      Tdap (Tetanus, Diphtheria, Pertussis) Vaccine: What You Need to Know 1. Why get vaccinated? Tdap vaccine can prevent tetanus, diphtheria, and pertussis. Diphtheria and pertussis spread from person to person. Tetanus enters the body through cuts or wounds.  TETANUS (T) causes painful stiffening of the muscles. Tetanus can lead to serious health problems, including being unable to open the mouth, having trouble swallowing and breathing, or death.  DIPHTHERIA (D) can lead to difficulty breathing, heart failure, paralysis, or death.  PERTUSSIS (aP), also known as "whooping cough," can cause uncontrollable, violent coughing that makes it hard to breathe, eat, or drink. Pertussis can be extremely serious especially in babies and young children, causing pneumonia, convulsions, brain damage, or death. In teens and adults, it can cause weight loss, loss of bladder control, passing out, and rib fractures from severe coughing. 2. Tdap vaccine Tdap is only for children 7 years and older, adolescents, and adults.  Adolescents should receive a single dose of Tdap, preferably at age 48 or 12 years. Pregnant people should get a dose of Tdap during every pregnancy, preferably during the early part of the third trimester, to help protect the newborn from pertussis. Infants are most at risk for severe, life-threatening complications from pertussis. Adults who have never received Tdap should get a dose of Tdap. Also, adults should receive a  booster dose of either Tdap or Td (a different vaccine that protects against tetanus and diphtheria but not pertussis) every 10 years, or after 5 years in the case of a severe or dirty wound or burn. Tdap may be given at the same time as other vaccines. 3. Talk with your health care provider Tell your vaccine provider if the person getting the vaccine:  Has had an allergic reaction after a previous dose of any vaccine that protects against tetanus, diphtheria, or pertussis, or has any severe, life-threatening allergies  Has had a coma, decreased level of consciousness, or prolonged seizures within 7 days after a previous dose of any pertussis vaccine (DTP, DTaP, or Tdap)  Has seizures or another nervous system problem  Has ever had Guillain-Barr Syndrome (also called "GBS")  Has had severe pain or swelling after a previous dose of any vaccine that protects against tetanus or diphtheria In some cases, your health care provider may decide to postpone Tdap vaccination until a future visit. People with minor illnesses, such as a cold, may be vaccinated. People who are moderately or severely ill should usually wait until they recover before getting Tdap vaccine.  Your health care provider can give you more information. 4. Risks of a vaccine reaction  Pain, redness, or swelling where the shot was given, mild fever, headache, feeling tired, and nausea, vomiting, diarrhea, or stomachache sometimes happen after Tdap vaccination. People sometimes faint after medical procedures, including vaccination. Tell your provider if you feel dizzy or have vision changes or ringing in the ears.  As with any medicine, there is a very remote chance of a vaccine causing a severe allergic reaction,  other serious injury, or death. 5. What if there is a serious problem? An allergic reaction could occur after the vaccinated person leaves the clinic. If you see signs of a severe allergic reaction (hives, swelling of the  face and throat, difficulty breathing, a fast heartbeat, dizziness, or weakness), call 9-1-1 and get the person to the nearest hospital. For other signs that concern you, call your health care provider.  Adverse reactions should be reported to the Vaccine Adverse Event Reporting System (VAERS). Your health care provider will usually file this report, or you can do it yourself. Visit the VAERS website at www.vaers.LAgents.no or call 260-721-0255. VAERS is only for reporting reactions, and VAERS staff members do not give medical advice. 6. The National Vaccine Injury Compensation Program The [employer] Vaccine Injury Compensation Program (VICP) is a federal program that was created to compensate people who may have been injured by certain vaccines. Claims regarding alleged injury or death due to vaccination have a time limit for filing, which may be as short as two years. Visit the VICP website at SpiritualWord.at or call (813)043-7082 to learn about the program and about filing a claim. 7. How can I learn more?  Ask your health care provider.  Call your local or state health department.  Visit the website of the Food and Drug Administration (FDA) for vaccine package inserts and additional information at FinderList.no.  Contact the Centers for Disease Control and Prevention (CDC): ? Call 651-797-1871 (1-800-CDC-INFO) or ? Visit CDC's website at PicCapture.uy. Vaccine Information Statement Tdap (Tetanus, Diphtheria, Pertussis) Vaccine (09/15/2019) This information is not intended to replace advice given to you by your health care provider. Make sure you discuss any questions you have with your health care provider. Document Revised: 10/11/2019 Document Reviewed: 10/11/2019 Elsevier Patient Education  2021 Elsevier Inc.    Calorie Counting for [employer] Calories are units of energy. Your body needs a certain number of calories from food to  keep going throughout the day. When you eat or drink more calories than your body needs, your body stores the extra calories mostly as fat. When you eat or drink fewer calories than your body needs, your body burns fat to get the energy it needs. Calorie counting means keeping track of how many calories you eat and drink each day. Calorie counting can be helpful if you need to lose weight. If you eat fewer calories than your body needs, you should lose weight. Ask your health care provider what a healthy weight is for you. For calorie counting to work, you will need to eat the right number of calories each day to lose a healthy amount of weight per week. A dietitian can help you figure out how many calories you need in a day and will suggest ways to reach your calorie goal.  A healthy amount of weight to lose each week is usually 1-2 lb (0.5-0.9 kg). This usually means that your daily calorie intake should be reduced by 500-750 calories.  Eating 1,200-1,500 calories a day can help most women lose weight.  Eating 1,500-1,800 calories a day can help most men lose weight. What do I need to know about calorie counting? Work with your health care provider or dietitian to determine how many calories you should get each day. To meet your daily calorie goal, you will need to:  Find out how many calories are in each food that you would like to eat. Try to do this before you eat.  Decide how much  of the food you plan to eat.  Keep a food log. Do this by writing down what you ate and how many calories it had. To successfully lose weight, it is important to balance calorie counting with a healthy lifestyle that includes regular activity. Where do I find calorie information? The number of calories in a food can be found on a Nutrition Facts label. If a food does not have a Nutrition Facts label, try to look up the calories online or ask your dietitian for help. Remember that calories are listed per serving.  If you choose to have more than one serving of a food, you will have to multiply the calories per serving by the number of servings you plan to eat. For example, the label on a package of bread might say that a serving size is 1 slice and that there are 90 calories in a serving. If you eat 1 slice, you will have eaten 90 calories. If you eat 2 slices, you will have eaten 180 calories.   How do I keep a food log? After each time that you eat, record the following in your food log as soon as possible:  What you ate. Be sure to include toppings, sauces, and other extras on the food.  How much you ate. This can be measured in cups, ounces, or number of items.  How many calories were in each food and drink.  The total number of calories in the food you ate. Keep your food log near you, such as in a pocket-sized notebook or on an app or website on your mobile phone. Some programs will calculate calories for you and show you how many calories you have left to meet your daily goal. What are some portion-control tips?  Know how many calories are in a serving. This will help you know how many servings you can have of a certain food.  Use a measuring cup to measure serving sizes. You could also try weighing out portions on a kitchen scale. With time, you will be able to estimate serving sizes for some foods.  Take time to put servings of different foods on your favorite plates or in your favorite bowls and cups so you know what a serving looks like.  Try not to eat straight from a food's packaging, such as from a bag or box. Eating straight from the package makes it hard to see how much you are eating and can lead to overeating. Put the amount you would like to eat in a cup or on a plate to make sure you are eating the right portion.  Use smaller plates, glasses, and bowls for smaller portions and to prevent overeating.  Try not to multitask. For example, avoid watching TV or using your computer while  eating. If it is time to eat, sit down at a table and enjoy your food. This will help you recognize when you are full. It will also help you be more mindful of what and how much you are eating. What are tips for following this plan? Reading food labels  Check the calorie count compared with the serving size. The serving size may be smaller than what you are used to eating.  Check the source of the calories. Try to choose foods that are high in protein, fiber, and vitamins, and low in saturated fat, trans fat, and sodium. Shopping  Read nutrition labels while you shop. This will help you make healthy decisions about which foods to  buy.  Pay attention to nutrition labels for low-fat or fat-free foods. These foods sometimes have the same number of calories or more calories than the full-fat versions. They also often have added sugar, starch, or salt to make up for flavor that was removed with the fat.  Make a grocery list of lower-calorie foods and stick to it. Cooking  Try to cook your favorite foods in a healthier way. For example, try baking instead of frying.  Use low-fat dairy products. Meal planning  Use more fruits and vegetables. One-half of your plate should be fruits and vegetables.  Include lean proteins, such as chicken, [place], and fish. Lifestyle Each week, aim to do one of the following:  150 minutes of moderate exercise, such as walking.  75 minutes of vigorous exercise, such as running. General information  Know how many calories are in the foods you eat most often. This will help you calculate calorie counts faster.  Find a way of tracking calories that works for you. Get creative. Try different apps or programs if writing down calories does not work for you. What foods should I eat?  Eat nutritious foods. It is better to have a nutritious, high-calorie food, such as an avocado, than a food with few nutrients, such as a bag of potato chips.  Use your calories on  foods and drinks that will fill you up and will not leave you hungry soon after eating. ? Examples of foods that fill you up are nuts and nut butters, vegetables, lean proteins, and high-fiber foods such as whole grains. High-fiber foods are foods with more than 5 g of fiber per serving.  Pay attention to calories in drinks. Low-calorie drinks include water and unsweetened drinks. The items listed above may not be a complete list of foods and beverages you can eat. Contact a dietitian for more information.   What foods should I limit? Limit foods or drinks that are not good sources of vitamins, minerals, or protein or that are high in unhealthy fats. These include:  Candy.  Other sweets.  Sodas, specialty coffee drinks, alcohol, and juice. The items listed above may not be a complete list of foods and beverages you should avoid. Contact a dietitian for more information. How do I count calories when eating out?  Pay attention to portions. Often, portions are much larger when eating out. Try these tips to keep portions smaller: ? Consider sharing a meal instead of getting your own. ? If you get your own meal, eat only half of it. Before you start eating, ask for a container and put half of your meal into it. ? When available, consider ordering smaller portions from the menu instead of full portions.  Pay attention to your food and drink choices. Knowing the way food is cooked and what is included with the meal can help you eat fewer calories. ? If calories are listed on the menu, choose the lower-calorie options. ? Choose dishes that include vegetables, fruits, whole grains, low-fat dairy products, and lean proteins. ? Choose items that are boiled, broiled, grilled, or steamed. Avoid items that are buttered, battered, fried, or served with cream sauce. Items labeled as crispy are usually fried, unless stated otherwise. ? Choose water, low-fat milk, unsweetened iced tea, or other drinks without  added sugar. If you want an alcoholic beverage, choose a lower-calorie option, such as a glass of wine or light beer. ? Ask for dressings, sauces, and syrups on the side. These are usually  high in calories, so you should limit the amount you eat. ? If you want a salad, choose a garden salad and ask for grilled meats. Avoid extra toppings such as bacon, cheese, or fried items. Ask for the dressing on the side, or ask for olive oil and vinegar or lemon to use as dressing.  Estimate how many servings of a food you are given. Knowing serving sizes will help you be aware of how much food you are eating at restaurants. Where to find more information  Centers for Disease Control and Prevention: FootballExhibition.com.br  U.S. Department of Agriculture: WrestlingReporter.dk Summary  Calorie counting means keeping track of how many calories you eat and drink each day. If you eat fewer calories than your body needs, you should lose weight.  A healthy amount of weight to lose per week is usually 1-2 lb (0.5-0.9 kg). This usually means reducing your daily calorie intake by 500-750 calories.  The number of calories in a food can be found on a Nutrition Facts label. If a food does not have a Nutrition Facts label, try to look up the calories online or ask your dietitian for help.  Use smaller plates, glasses, and bowls for smaller portions and to prevent overeating.  Use your calories on foods and drinks that will fill you up and not leave you hungry shortly after a meal. This information is not intended to replace advice given to you by your health care provider. Make sure you discuss any questions you have with your health care provider. Document Revised: 03/09/2019 Document Reviewed: 03/09/2019 Elsevier Patient Education  2021 [employer].

## 2020-07-02 NOTE — Assessment & Plan Note (Signed)
Chronic, ongoing.  Continue Zyrtec 10 mg and Flonase spray.

## 2020-07-02 NOTE — Telephone Encounter (Signed)
error 

## 2020-07-02 NOTE — Assessment & Plan Note (Signed)
Due for tetanus and agrees. Declines Shingrix. Pap smear UTD, due in 2023. Mammogram UTD. Colonoscopy could be due, she's unsure of when she had this. She thinks 2014, she may be due now.   Discussed the importance of a healthy diet and regular exercise in order for weight loss, and to reduce the risk of any potential medical problems.  Exam today unremarkable. Labs pending.

## 2020-07-02 NOTE — Assessment & Plan Note (Signed)
Above goal in the office today, home readings are at goal. Continue HCTZ 12.5 mg daily.

## 2020-07-02 NOTE — Progress Notes (Signed)
Subjective:    Patient ID: Tara Marshall, female    DOB: 10/08/1960, 60 y.o.   MRN: [id number]  HPI  Tara Marshall is a very pleasant 60 y.o. female who presents today for complete physical.  Immunizations: -Tetanus: 2012, Due today -Influenza: Did not complete  -Covid-19: Has not completed -Shingles: Declines   Diet: She endorses a fair diet. Eating a lot of take out food.  Exercise: She is exercising some.  Eye exam: No recent exam Dental exam: Completes semi-annually   Pap Smear: Completed in September 2019 Mammogram: November 2021 Colonoscopy: Unsure, maybe 2012 or 2014  BP Readings from Last 3 Encounters:  07/02/20 136/82  05/19/19 126/80  05/09/19 140/90   She checks her BP at home which runs 120's/70's.     Review of Systems  Constitutional: Negative for unexpected weight change.  HENT: Negative for rhinorrhea.   Eyes: Negative for visual disturbance.  Respiratory: Negative for cough and shortness of breath.   Cardiovascular: Negative for chest pain.  Gastrointestinal: Negative for constipation and diarrhea.  Genitourinary: Negative for difficulty urinating.  Musculoskeletal: Negative for arthralgias.  Skin: Negative for rash.  Allergic/Immunologic: Negative for environmental allergies.  Neurological: Positive for headaches. Negative for dizziness.  Psychiatric/Behavioral: The patient is not nervous/anxious.          Past Medical History:  Diagnosis Date  . Arthritis   . Carotid bruit    right   . Chicken pox   . Hypertension   . Migraine   . Other ill-defined heart disease     Social History   Socioeconomic History  . Marital status: Married    Spouse name: Not on file  . Number of children: Not on file  . Years of education: Not on file  . Highest education level: Not on file  Occupational History  . Not on file  Tobacco Use  . Smoking status: Never Smoker  . Smokeless tobacco: Never Used  Substance and Sexual Activity  . Alcohol  use: Yes    Alcohol/week: 1.0 standard drink    Types: 1 Standard drinks or equivalent per week  . Drug use: No  . Sexual activity: Not on file  Other Topics Concern  . Not on file  Social History Narrative   Married.   3 children   Highest level of education 12th grade.   Works as a Arts development officer.   Enjoys playing on the computer, walking, organizing.   Social Determinants of Health   Financial Resource Strain: Not on file  Food Insecurity: Not on file  Transportation Needs: Not on file  Physical Activity: Not on file  Stress: Not on file  Social Connections: Not on file  Intimate Partner Violence: Not on file    Past Surgical History:  Procedure Laterality Date  . BREAST CYST ASPIRATION     at age 45, possibly left breast  . CESAREAN SECTION    . TUBAL LIGATION      Family History  Problem Relation Age of Onset  . Heart attack Mother   . Hypertension Mother     No Known Allergies  Current Outpatient Medications on File Prior to Visit  Medication Sig Dispense Refill  . cetirizine (ZYRTEC) 10 MG tablet Take 1 tablet (10 mg total) by mouth at bedtime. For allergies. 90 tablet 0  . fluticasone (FLONASE) 50 MCG/ACT nasal spray Place 1 spray into both nostrils 2 (two) times daily. 16 g 0  . hydrochlorothiazide (HYDRODIURIL) 12.5 MG tablet  Take 1 tablet by mouth once daily for blood pressure 30 tablet 0   No current facility-administered medications on file prior to visit.    BP 136/82   Pulse 75   Temp (!) 96.7 F (35.9 C) (Temporal)   Ht 5\' 6"  (1.676 m)   Wt 224 lb (101.6 kg)   SpO2 96%   BMI 36.15 kg/m  Objective:   Physical Exam HENT:     Right Ear: Tympanic membrane and ear canal normal.     Left Ear: Tympanic membrane and ear canal normal.     Nose: Nose normal.  Eyes:     Conjunctiva/sclera: Conjunctivae normal.     Pupils: Pupils are equal, round, and reactive to light.  Neck:     Thyroid: No thyromegaly.  Cardiovascular:     Rate and Rhythm:  Normal rate and regular rhythm.     Heart sounds: No murmur heard.   Pulmonary:     Effort: Pulmonary effort is normal.     Breath sounds: Normal breath sounds. No rales.  Abdominal:     General: Bowel sounds are normal.     Palpations: Abdomen is soft.     Tenderness: There is no abdominal tenderness.  Musculoskeletal:        General: Normal range of motion.     Cervical back: Neck supple.  Lymphadenopathy:     Cervical: No cervical adenopathy.  Skin:    General: Skin is warm and dry.     Findings: No rash.  Neurological:     Mental Status: She is alert and oriented to person, place, and time.     Cranial Nerves: No cranial nerve deficit.     Deep Tendon Reflexes: Reflexes are normal and symmetric.  Psychiatric:        Mood and Affect: Mood normal.           Assessment & Plan:      This visit occurred during the SARS-CoV-2 public health emergency.  Safety protocols were in place, including screening questions prior to the visit, additional usage of staff PPE, and extensive cleaning of exam room while observing appropriate contact time as indicated for disinfecting solutions.

## 2020-07-03 LAB — HIV ANTIBODY (ROUTINE TESTING W REFLEX): HIV 1&2 Ab, 4th Generation: NONREACTIVE

## 2020-07-09 ENCOUNTER — Other Ambulatory Visit: Payer: Self-pay | Admitting: Primary Care

## 2020-07-09 DIAGNOSIS — E785 Hyperlipidemia, unspecified: Secondary | ICD-10-CM

## 2020-07-09 MED ORDER — ATORVASTATIN CALCIUM 10 MG PO TABS: 10.0000 mg | ORAL_TABLET | Freq: Every day | ORAL | 3 refills | Status: DC

## 2020-08-14 ENCOUNTER — Other Ambulatory Visit: Payer: Self-pay | Admitting: Primary Care

## 2020-08-14 DIAGNOSIS — I10 Essential (primary) hypertension: Secondary | ICD-10-CM

## 2020-09-09 ENCOUNTER — Other Ambulatory Visit (INDEPENDENT_AMBULATORY_CARE_PROVIDER_SITE_OTHER): Payer: BC Managed Care – PPO

## 2020-09-09 ENCOUNTER — Other Ambulatory Visit: Payer: Self-pay

## 2020-09-09 DIAGNOSIS — E785 Hyperlipidemia, unspecified: Secondary | ICD-10-CM | POA: Diagnosis not present

## 2020-09-09 LAB — LIPID PANEL
Cholesterol: 139 mg/dL (ref 0–200)
HDL: 63 mg/dL (ref >39.00)
LDL Cholesterol: 67 mg/dL (ref 0–99)
NonHDL: 76.12
Total CHOL/HDL Ratio: 2
Triglycerides: 45 mg/dL (ref 0.0–149.0)
VLDL: 9 mg/dL (ref 0.0–40.0)

## 2020-09-09 LAB — HEPATIC FUNCTION PANEL
ALT: 18 U/L (ref 0–35)
AST: 19 U/L (ref 0–37)
Albumin: 4.1 g/dL (ref 3.5–5.2)
Alkaline Phosphatase: 57 U/L (ref 39–117)
Bilirubin, Direct: 0.1 mg/dL (ref 0.0–0.3)
Total Bilirubin: 0.6 mg/dL (ref 0.2–1.2)
Total Protein: 7.3 g/dL (ref 6.0–8.3)

## 2020-11-29 ENCOUNTER — Other Ambulatory Visit: Payer: Self-pay | Admitting: Primary Care

## 2020-11-29 DIAGNOSIS — Z1231 Encounter for screening mammogram for malignant neoplasm of breast: Secondary | ICD-10-CM

## 2020-12-06 ENCOUNTER — Telehealth: Payer: Self-pay | Admitting: Primary Care

## 2020-12-06 NOTE — Telephone Encounter (Signed)
Pt called stating was there a recall on medication hydrochlorothiazide (HYDRODIURIL) 12.5 MG tablet. Please advise.

## 2020-12-06 NOTE — Telephone Encounter (Signed)
Called patient let them know to contact pharmacy to see if effected by recall. Only limited lot and manufactures were recalled. Patient will contact pharmacy.

## 2020-12-06 NOTE — Telephone Encounter (Signed)
Noted and agree. 

## 2020-12-26 ENCOUNTER — Other Ambulatory Visit: Payer: Self-pay

## 2020-12-26 ENCOUNTER — Other Ambulatory Visit: Payer: Self-pay | Admitting: Primary Care

## 2020-12-26 ENCOUNTER — Ambulatory Visit
Admission: RE | Admit: 2020-12-26 | Discharge: 2020-12-26 | Disposition: A | Discharge status: Home / Self Care | Payer: BC Managed Care – PPO | Source: Ambulatory Visit | Attending: Primary Care | Admitting: Primary Care

## 2020-12-26 DIAGNOSIS — E785 Hyperlipidemia, unspecified: Secondary | ICD-10-CM

## 2020-12-26 DIAGNOSIS — Z1231 Encounter for screening mammogram for malignant neoplasm of breast: Secondary | ICD-10-CM | POA: Diagnosis present

## 2020-12-26 NOTE — Telephone Encounter (Signed)
We can try rosuvastatin 5 mg for cholesterol in place of atorvastatin.  If she'd like to do that then okay to send in pended Rx. She needs CPE in May 2023

## 2020-12-26 NOTE — Telephone Encounter (Signed)
Pt called in stated since she been on atorvastatin (LIPITOR) 10 MG tablet she been getting leg cramps or pull muscles . Would like a call back to know what can be done #[phone]

## 2020-12-30 MED ORDER — ROSUVASTATIN CALCIUM 5 MG PO TABS: 5.0000 mg | ORAL_TABLET | Freq: Every day | ORAL | 1 refills | Status: DC

## 2020-12-30 NOTE — Telephone Encounter (Signed)
Patient is ok with medications changes if does not help with craps she will let our office know so that we can set up appointment to see what other options are available. Have called in script as pended.

## 2021-03-24 ENCOUNTER — Telehealth: Payer: Self-pay

## 2021-03-24 NOTE — Telephone Encounter (Signed)
Per care everywhere pt is at [facility] ED now. Sending note to Dr Selena Batten who is in office, Joellen CMA and Allayne Gitelman NP who is PCP and out of office.

## 2021-03-24 NOTE — Telephone Encounter (Signed)
[facility] Day - Client TELEPHONE ADVICE RECORD [facility] Patient Name: Tara Marshall Gender: Female DOB: [birth year] Age: 61 Y 11 M 11 D Return Phone Number: [phone] (Primary), [phone] (Secondary) Address: City/ State/ Zip: [place]  [address] Client [facility] Day - Client Client Site [facility] - Day Provider Vernona Rieger - NP Contact Type Call Who Is Calling Patient / Member / Family / Caregiver Call Type Triage / Clinical Relationship To Patient Self Return Phone Number [phone] (Primary) Chief Complaint CHEST PAIN - pain, pressure, heaviness or tightness Reason for Call Symptomatic / Request for Health Information Initial Comment Caller states she has back, chest, should pain and burning pulling tightness sensation. GOTO Facility Not Listed [facility] Translation No Nurse Assessment Nurse: Freida Busman, RN, Diane Date/Time Lamount Cohen Time): 03/24/2021 8:55:24 AM Confirm and document reason for call. If symptomatic, describe symptoms. ---Caller states she has back, chest, left shoulder pain. Started last week, went away and then came back last night. Mainly on the left side. No difficulty breathing. No other symptoms. Pain comes and goes and is not severe. Does the patient have any new or worsening symptoms? ---Yes Will a triage be completed? ---Yes Related visit to physician within the last 2 weeks? ---No Does the PT have any chronic conditions? (i.e. diabetes, asthma, this includes High risk factors for pregnancy, etc.) ---Yes List chronic conditions. ---HTN, high cholesterol Is this a behavioral health or substance abuse call? ---No Guidelines Guideline Title Affirmed Question Affirmed Notes Nurse Date/Time (Eastern Time) Chest Pain [1] Chest pain (or "angina") comes and goes AND [2] is happening more often (increasing in frequency) or getting worse (increasing  in [place], [place], Diane 03/24/2021 8:58:10 AM PLEASE NOTE: All timestamps contained within this report are represented as [place] Standard Time. CONFIDENTIALTY NOTICE: This fax transmission is intended only for the addressee. It contains information that is legally privileged, confidential or otherwise protected from use or disclosure. If you are not the intended recipient, you are strictly prohibited from reviewing, disclosing, copying using or disseminating any of this information or taking any action in reliance on or regarding this information. If you have received this fax in error, please notify us immediately by telephone so that we can arrange for its return to [place]. Phone: [phone], Toll-Free: [phone], Fax: [phone] Page: 2 of 2 Call Id: [id number] Guidelines Guideline Title Affirmed Question Affirmed Notes Nurse Date/Time Lamount Cohen Time) severity) (Exception: chest pains that last only a few seconds) Disp. Time Lamount Cohen Time) Disposition Final User 03/24/2021 8:53:16 AM Send to Urgent Queue Adriana Reams 03/24/2021 9:00:34 AM Go to ED Now Yes Freida Busman, RN, Diane Caller Disagree/Comply Comply Caller Understands Yes PreDisposition Call Doctor Care Advice Given Per Guideline GO TO ED NOW: * You need to be seen in the Emergency Department. ANOTHER ADULT SHOULD DRIVE: * It is better and safer if another adult drives instead of you. * Do not eat or drink anything for now. NOTHING BY MOUTH: CALL EMS IF: * Severe difficulty breathing occurs * Passes out or becomes too weak to stand CARE ADVICE given per Chest Pain (Adult) guideline. Referrals GO TO FACILITY OTHER - SPECIF

## 2021-03-24 NOTE — Telephone Encounter (Signed)
Noted, agree with need for evaluation 

## 2021-07-09 ENCOUNTER — Other Ambulatory Visit (HOSPITAL_COMMUNITY)
Admission: RE | Admit: 2021-07-09 | Discharge: 2021-07-09 | Disposition: A | Discharge status: Home / Self Care | Payer: BC Managed Care – PPO | Source: Ambulatory Visit | Attending: Primary Care | Admitting: Primary Care

## 2021-07-09 ENCOUNTER — Encounter: Payer: Self-pay | Admitting: Primary Care

## 2021-07-09 ENCOUNTER — Ambulatory Visit (INDEPENDENT_AMBULATORY_CARE_PROVIDER_SITE_OTHER): Payer: BC Managed Care – PPO | Admitting: Primary Care

## 2021-07-09 VITALS — BP 130/76 | HR 66 | Temp 98.6°F | Ht 66.0 in | Wt 217.0 lb

## 2021-07-09 DIAGNOSIS — E785 Hyperlipidemia, unspecified: Secondary | ICD-10-CM

## 2021-07-09 DIAGNOSIS — Z124 Encounter for screening for malignant neoplasm of cervix: Secondary | ICD-10-CM | POA: Insufficient documentation

## 2021-07-09 DIAGNOSIS — I10 Essential (primary) hypertension: Secondary | ICD-10-CM | POA: Diagnosis not present

## 2021-07-09 DIAGNOSIS — M25512 Pain in left shoulder: Secondary | ICD-10-CM

## 2021-07-09 DIAGNOSIS — R7303 Prediabetes: Secondary | ICD-10-CM | POA: Diagnosis not present

## 2021-07-09 DIAGNOSIS — Z0000 Encounter for general adult medical examination without abnormal findings: Secondary | ICD-10-CM | POA: Diagnosis not present

## 2021-07-09 DIAGNOSIS — J302 Other seasonal allergic rhinitis: Secondary | ICD-10-CM

## 2021-07-09 DIAGNOSIS — G8929 Other chronic pain: Secondary | ICD-10-CM

## 2021-07-09 DIAGNOSIS — R519 Headache, unspecified: Secondary | ICD-10-CM

## 2021-07-09 HISTORY — DX: Other chronic pain: G89.29

## 2021-07-09 LAB — COMPREHENSIVE METABOLIC PANEL
ALT: 19 U/L (ref 0–35)
AST: 19 U/L (ref 0–37)
Albumin: 4.6 g/dL (ref 3.5–5.2)
Alkaline Phosphatase: 55 U/L (ref 39–117)
BUN: 16 mg/dL (ref 6–23)
CO2: 30 mEq/L (ref 19–32)
Calcium: 9.9 mg/dL (ref 8.4–10.5)
Chloride: 101 mEq/L (ref 96–112)
Creatinine, Ser: 0.87 mg/dL (ref 0.40–1.20)
GFR: 70.99 mL/min (ref >60.00)
Glucose, Bld: 93 mg/dL (ref 70–99)
Potassium: 3.6 mEq/L (ref 3.5–5.1)
Sodium: 138 mEq/L (ref 135–145)
Total Bilirubin: 0.6 mg/dL (ref 0.2–1.2)
Total Protein: 7.9 g/dL (ref 6.0–8.3)

## 2021-07-09 LAB — LIPID PANEL
Cholesterol: 208 mg/dL — ABNORMAL HIGH (ref 0–200)
HDL: 68.9 mg/dL (ref >39.00)
LDL Cholesterol: 126 mg/dL — ABNORMAL HIGH (ref 0–99)
NonHDL: 139.43
Total CHOL/HDL Ratio: 3
Triglycerides: 65 mg/dL (ref 0.0–149.0)
VLDL: 13 mg/dL (ref 0.0–40.0)

## 2021-07-09 LAB — HEMOGLOBIN A1C: Hgb A1c MFr Bld: 6 % (ref 4.6–6.5)

## 2021-07-09 NOTE — Assessment & Plan Note (Signed)
Declines Shingrix vaccine. Tetanus UTD. Mammogram UTD. Colonoscopy UTD, due 2024.  Discussed the importance of a healthy diet and regular exercise in order for weight loss, and to reduce the risk of further co-morbidity.  Exam today stable. Labs pending

## 2021-07-09 NOTE — Patient Instructions (Signed)
Stop by the lab prior to leaving today. I will notify you of your results once received.   Please schedule a visit with our Sports Medicine doctor, Dr. Lorelei Pont, for your shoulder.  It was a pleasure to see you today!  Preventive Care 63-64 Years Old, Female Preventive care refers to lifestyle choices and visits with your health care provider that can promote health and wellness. Preventive care visits are also called wellness exams. What can I expect for my preventive care visit? Counseling Your health care provider may ask you questions about your: Medical history, including: Past medical problems. Family medical history. Pregnancy history. Current health, including: Menstrual cycle. Method of birth control. Emotional well-being. Home life and relationship well-being. Sexual activity and sexual health. Lifestyle, including: Alcohol, nicotine or tobacco, and drug use. Access to firearms. Diet, exercise, and sleep habits. Work and work Statistician. Sunscreen use. Safety issues such as seatbelt and bike helmet use. Physical exam Your health care provider will check your: Height and weight. These may be used to calculate your BMI (body mass index). BMI is a measurement that tells if you are at a healthy weight. Waist circumference. This measures the distance around your waistline. This measurement also tells if you are at a healthy weight and may help predict your risk of certain diseases, such as type 2 diabetes and high blood pressure. Heart rate and blood pressure. Body temperature. Skin for abnormal spots. What immunizations do I need?  Vaccines are usually given at various ages, according to a schedule. Your health care provider will recommend vaccines for you based on your age, medical history, and lifestyle or other factors, such as travel or where you work. What tests do I need? Screening Your health care provider may recommend screening tests for certain conditions. This  may include: Lipid and cholesterol levels. Diabetes screening. This is done by checking your blood sugar (glucose) after you have not eaten for a while (fasting). Pelvic exam and Pap test. Hepatitis B test. Hepatitis C test. HIV (human immunodeficiency virus) test. STI (sexually transmitted infection) testing, if you are at risk. Lung cancer screening. Colorectal cancer screening. Mammogram. Talk with your health care provider about when you should start having regular mammograms. This may depend on whether you have a family history of breast cancer. BRCA-related cancer screening. This may be done if you have a family history of breast, ovarian, tubal, or peritoneal cancers. Bone density scan. This is done to screen for osteoporosis. Talk with your health care provider about your test results, treatment options, and if necessary, the need for more tests. Follow these instructions at home: Eating and drinking  Eat a diet that includes fresh fruits and vegetables, whole grains, lean protein, and low-fat dairy products. Take vitamin and mineral supplements as recommended by your health care provider. Do not drink alcohol if: Your health care provider tells you not to drink. You are pregnant, may be pregnant, or are planning to become pregnant. If you drink alcohol: Limit how much you have to 0-1 drink a day. Know how much alcohol is in your drink. In the [place] one drink equals one 12 oz bottle of beer (355 mL), one 5 oz glass of wine (148 mL), or one 1 oz glass of hard liquor (44 mL). Lifestyle Brush your teeth every morning and night with fluoride toothpaste. Floss one time each day. Exercise for at least 30 minutes 5 or more days each week. Do not use any products that contain nicotine or tobacco.  These products include cigarettes, chewing tobacco, and vaping devices, such as e-cigarettes. If you need help quitting, ask your health care provider. Do not use drugs. If you are sexually  active, practice safe sex. Use a condom or other form of protection to prevent STIs. If you do not wish to become pregnant, use a form of birth control. If you plan to become pregnant, see your health care provider for a prepregnancy visit. Take aspirin only as told by your health care provider. Make sure that you understand how much to take and what form to take. Work with your health care provider to find out whether it is safe and beneficial for you to take aspirin daily. Find healthy ways to manage stress, such as: Meditation, yoga, or listening to music. Journaling. Talking to a trusted person. Spending time with friends and family. Minimize exposure to UV radiation to reduce your risk of skin cancer. Safety Always wear your seat belt while driving or riding in a vehicle. Do not drive: If you have been drinking alcohol. Do not ride with someone who has been drinking. When you are tired or distracted. While texting. If you have been using any mind-altering substances or drugs. Wear a helmet and other protective equipment during sports activities. If you have firearms in your house, make sure you follow all gun safety procedures. Seek help if you have been physically or sexually abused. What's next? Visit your health care provider once a year for an annual wellness visit. Ask your health care provider how often you should have your eyes and teeth checked. Stay up to date on all vaccines. This information is not intended to replace advice given to you by your health care provider. Make sure you discuss any questions you have with your health care provider. Document Revised: 07/24/2020 Document Reviewed: 07/24/2020 Elsevier Patient Education  [employer].

## 2021-07-09 NOTE — Assessment & Plan Note (Signed)
Exam today with normal ROM and strength.   Recommended sports medicine evaluation vs PT referral.  She will see Sports Medicine.

## 2021-07-09 NOTE — Progress Notes (Signed)
Subjective:    Patient ID: Tara Marshall, female    DOB: 04-15-1960, 61 y.o.   MRN: [id number]  HPI  Tara Marshall is a very pleasant 62 y.o. female who presents today for complete physical and follow up of chronic conditions.  She would also like to discuss chronic shoulder pain. Chronic to the left anterior shoulder with radiation to mid left humeral region. Also with decrease in ROM with pain and intermittent numbness. Symptoms began about 1 year ago, worse over the last 4-5 months. She denies trauma/injury. Her left upper extremity is her dominant. She will take Advil and Tylenol every other day for the last month.   She would also like to mention chronic headaches. Chronic for years, occur several times monthly, sometimes lasting for one week. Typically located to the left frontal lobe and left temporal lobe. She denies nausea, photophobia, phonophobia. She takes Tylenol and Advil with improvement. She has a family history of migraines in her sons and daughter. She has a personal history of migraines during her younger years, was never on treatment. She underwent MRI of her brain years ago which was negative.   Immunizations: -Tetanus: 2022 -Influenza: Did not complete last season  -Covid-19: Has not completed -Shingles: Has not completed, declines   Diet: Fair diet.  Exercise: Walking several days weekly  Eye exam: Completed several years ago  Dental exam: Completes semi-annually   Pap Smear: Completed in 2019, due today.  Mammogram: Completed in November 2022  Colonoscopy: Completed in 2014, due 2024  BP Readings from Last 3 Encounters:  07/09/21 130/76  07/02/20 136/82  05/19/19 126/80        Review of Systems  Constitutional:  Negative for unexpected weight change.  HENT:  Negative for rhinorrhea.   Eyes:  Negative for visual disturbance.  Respiratory:  Negative for shortness of breath.   Cardiovascular:  Negative for chest pain.  Gastrointestinal:  Negative  for constipation and diarrhea.  Genitourinary:  Negative for difficulty urinating.  Musculoskeletal:  Positive for arthralgias. Negative for myalgias.  Skin:  Negative for rash.  Allergic/Immunologic: Positive for environmental allergies.  Neurological:  Positive for headaches. Negative for dizziness.  Psychiatric/Behavioral:  The patient is not nervous/anxious.         Past Medical History:  Diagnosis Date   Arthritis    Carotid bruit    right    Chicken pox    Hypertension    Migraine    Other ill-defined heart disease     Social History   Socioeconomic History   Marital status: Married    Spouse name: Not on file   Number of children: Not on file   Years of education: Not on file   Highest education level: Not on file  Occupational History   Not on file  Tobacco Use   Smoking status: Never   Smokeless tobacco: Never  Substance and Sexual Activity   Alcohol use: Yes    Alcohol/week: 1.0 standard drink    Types: 1 Standard drinks or equivalent per week   Drug use: No   Sexual activity: Not on file  Other Topics Concern   Not on file  Social History Narrative   Married.   3 children   Highest level of education 12th grade.   Works as a Materials engineer.   Enjoys playing on the computer, walking, organizing.   Social Determinants of Health   Financial Resource Strain: Not on file  Food Insecurity: Not on file  Transportation Needs: Not on file  Physical Activity: Not on file  Stress: Not on file  Social Connections: Not on file  Intimate Partner Violence: Not on file    Past Surgical History:  Procedure Laterality Date   BREAST CYST ASPIRATION     at age 54, possibly left breast   CESAREAN SECTION     TUBAL LIGATION      Family History  Problem Relation Age of Onset   Heart attack Mother    Hypertension Mother     No Known Allergies  Current Outpatient Medications on File Prior to Visit  Medication Sig Dispense Refill   cetirizine (ZYRTEC) 10 MG  tablet Take 1 tablet (10 mg total) by mouth at bedtime. For allergies. 90 tablet 0   fluticasone (FLONASE) 50 MCG/ACT nasal spray Place 1 spray into both nostrils 2 (two) times daily. 16 g 0   hydrochlorothiazide (HYDRODIURIL) 12.5 MG tablet Take 1 tablet by mouth once daily for blood pressure 90 tablet 2   rosuvastatin (CRESTOR) 5 MG tablet Take 1 tablet (5 mg total) by mouth daily. For cholesterol. (Patient taking differently: Take 5 mg by mouth daily. For cholesterol. Only talking 3 times a week) 90 tablet 1   No current facility-administered medications on file prior to visit.    BP 130/76   Pulse 66   Temp 98.6 F (37 C) (Oral)   Ht 5\' 6"  (1.676 m)   Wt 217 lb (98.4 kg)   SpO2 96%   BMI 35.02 kg/m  Objective:   Physical Exam Exam conducted with a chaperone present.  HENT:     Right Ear: Tympanic membrane and ear canal normal.     Left Ear: Tympanic membrane and ear canal normal.     Nose: Nose normal.  Eyes:     Conjunctiva/sclera: Conjunctivae normal.     Pupils: Pupils are equal, round, and reactive to light.  Neck:     Thyroid: No thyromegaly.  Cardiovascular:     Rate and Rhythm: Normal rate and regular rhythm.     Heart sounds: No murmur heard. Pulmonary:     Effort: Pulmonary effort is normal.     Breath sounds: Normal breath sounds. No rales.  Abdominal:     General: Bowel sounds are normal.     Palpations: Abdomen is soft.     Tenderness: There is no abdominal tenderness.  Genitourinary:    Labia:        Right: No tenderness or lesion.        Left: No tenderness or lesion.      Vagina: Normal.     Cervix: Normal.     Uterus: Normal.      Adnexa: Right adnexa normal and left adnexa normal.  Musculoskeletal:        General: Normal range of motion.     Left shoulder: No bony tenderness. Normal range of motion. Normal strength.     Cervical back: Neck supple.  Lymphadenopathy:     Cervical: No cervical adenopathy.  Skin:    General: Skin is warm and dry.      Findings: No rash.     Comments: Several dark, healed sores to right upper buttocks and left gluteal cleft.   Neurological:     Mental Status: She is alert and oriented to person, place, and time.     Cranial Nerves: No cranial nerve deficit.     Deep Tendon Reflexes: Reflexes are normal and symmetric.  Psychiatric:  Mood and Affect: Mood normal.          Assessment & Plan:

## 2021-07-09 NOTE — Assessment & Plan Note (Signed)
Repeat lipid panel pending.  Continue rosuvastatin 5 mg three times weekly.  Commended her on dietary changes.

## 2021-07-09 NOTE — Assessment & Plan Note (Signed)
Ongoing, chronic for years.  Prior history of migraines.   No alarm signs on exam today. Offered treatment for which she kindly declines.  Consider repeat imaging if symptoms progress.   She will update.

## 2021-07-09 NOTE — Assessment & Plan Note (Signed)
Controlled.   Continue HCTZ 12.5 mg daily. CMP pending. 

## 2021-07-09 NOTE — Assessment & Plan Note (Signed)
Controlled.  Continue Zyrtec 10 mg daily and Flonase PRN.

## 2021-07-09 NOTE — Assessment & Plan Note (Signed)
Commended her on dietary changes, encouraged regular exercise.  Repeat A1C pending.

## 2021-07-10 LAB — CYTOLOGY - PAP
Adequacy: ABSENT
Comment: NEGATIVE
Diagnosis: NEGATIVE
High risk HPV: NEGATIVE

## 2021-08-27 ENCOUNTER — Other Ambulatory Visit: Payer: Self-pay | Admitting: Primary Care

## 2021-08-27 DIAGNOSIS — I10 Essential (primary) hypertension: Secondary | ICD-10-CM

## 2021-08-28 ENCOUNTER — Other Ambulatory Visit: Payer: Self-pay | Admitting: Primary Care

## 2021-08-28 DIAGNOSIS — E785 Hyperlipidemia, unspecified: Secondary | ICD-10-CM

## 2021-09-05 IMAGING — MG DIGITAL SCREENING BILAT W/ TOMO W/ CAD
8 series · 8 of 24 positions shown · non-contrast
Comparison: Previous exam(s).

CLINICAL DATA: Screening.

EXAM:
DIGITAL SCREENING BILATERAL MAMMOGRAM WITH TOMO AND CAD

[L CC synth-2D]
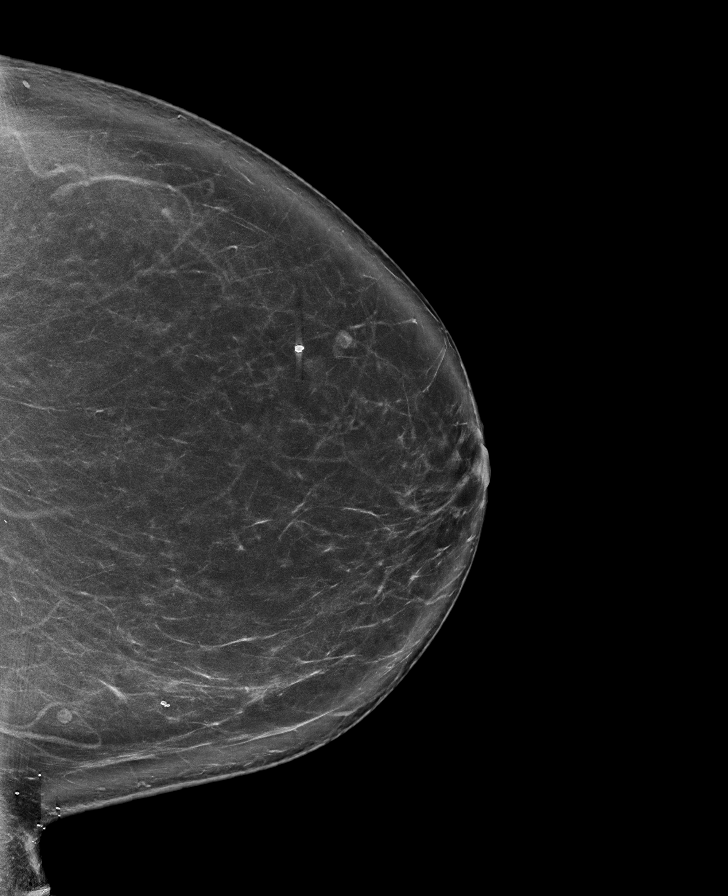

[L MLO synth-2D]
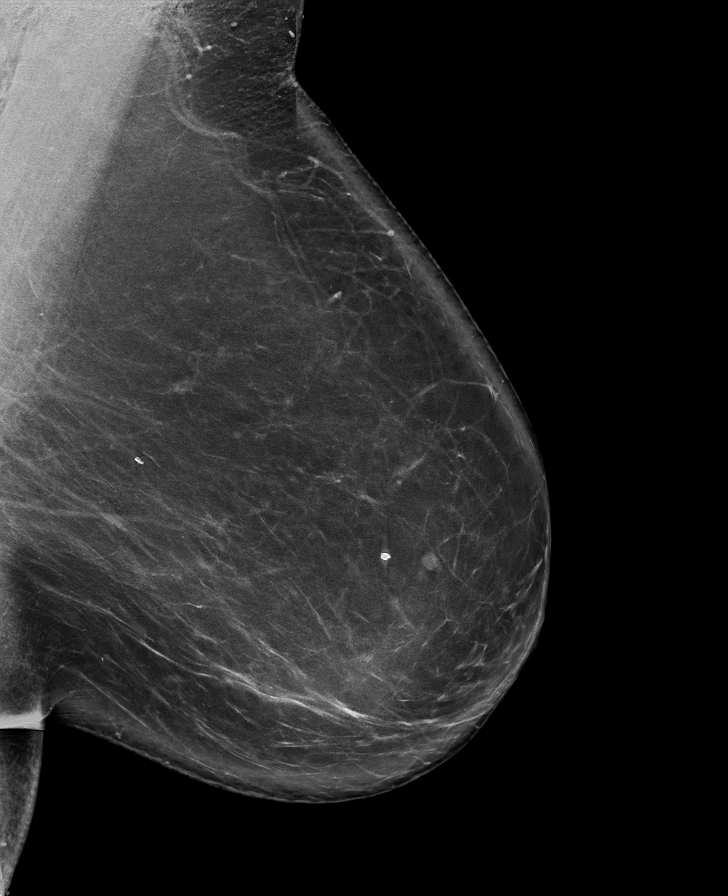

[R CC synth-2D]
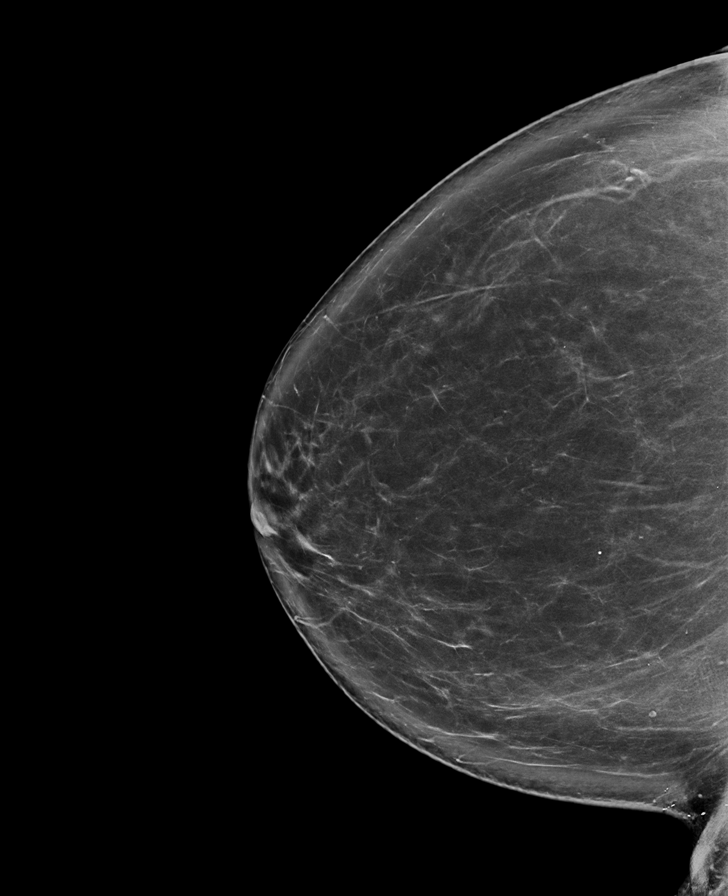

[R MLO synth-2D]
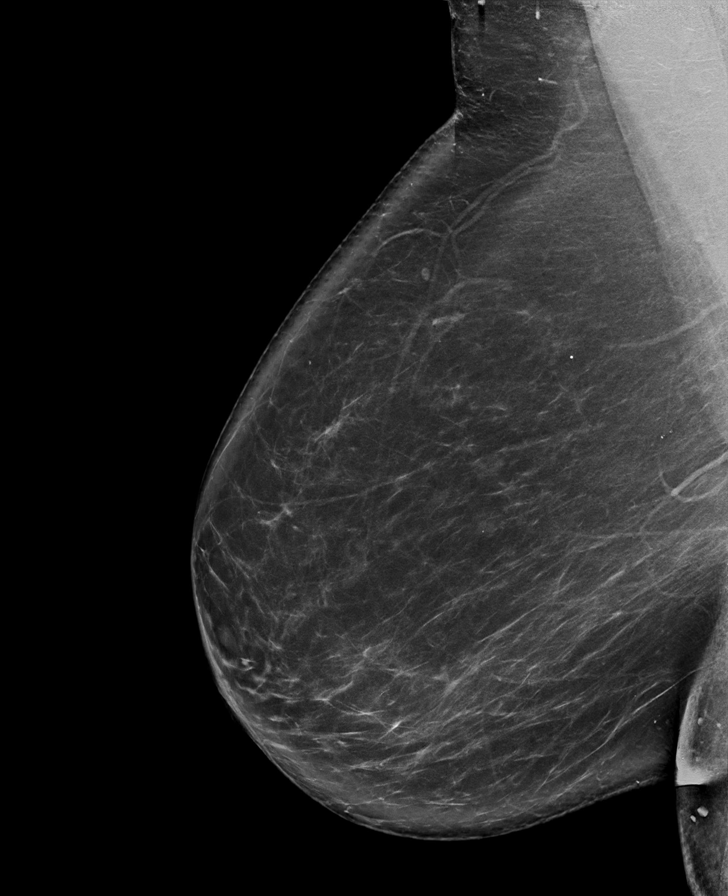

[R MLO tomo · tomo slice 51/100.0]
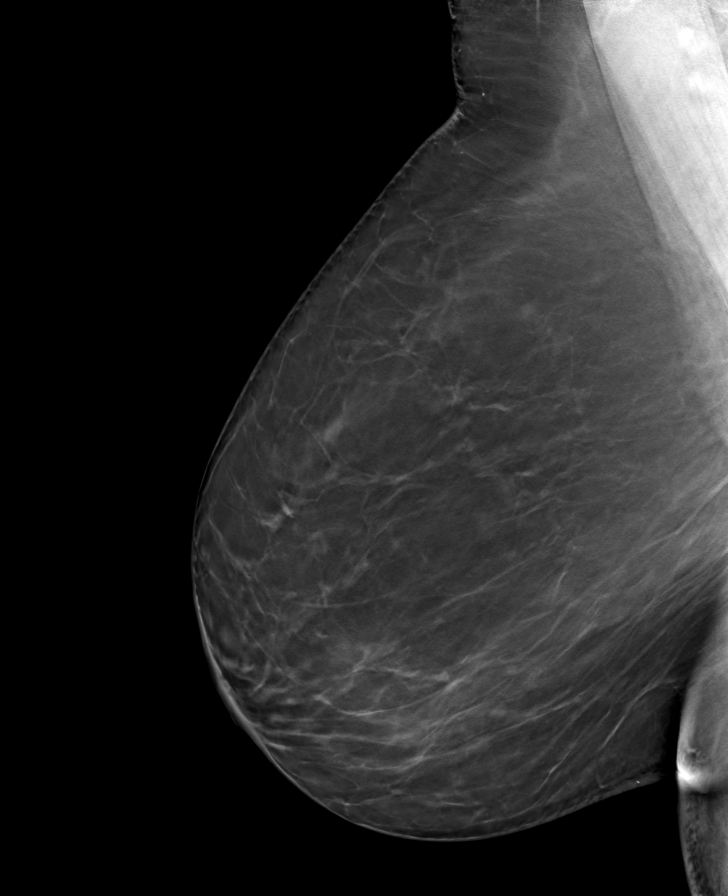

[L MLO tomo · tomo slice 51/102.0]
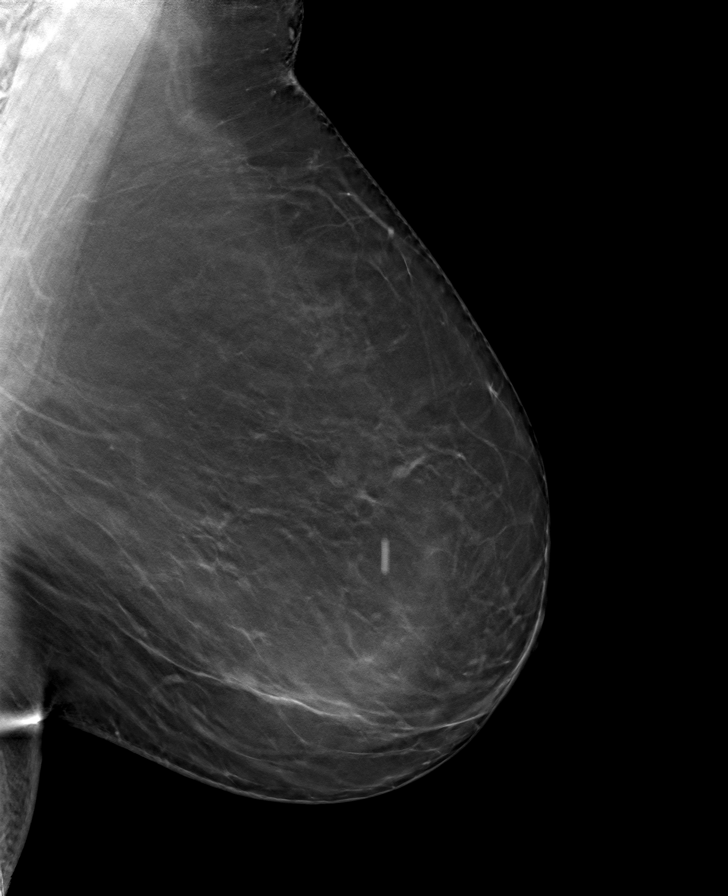

[L CC tomo · tomo slice 48/95.0]
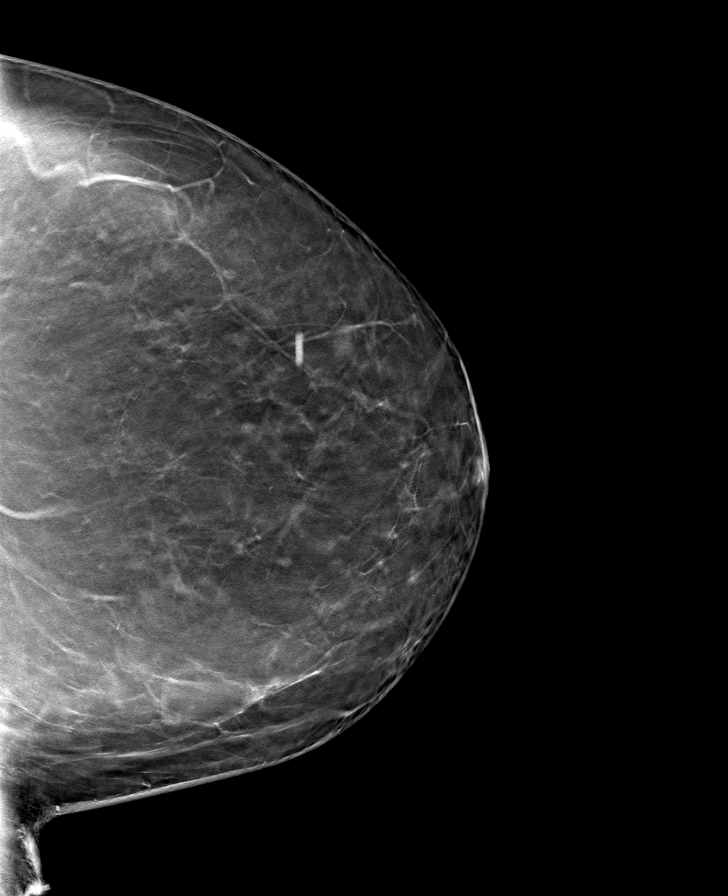

[R CC tomo · tomo slice 48/95.0]
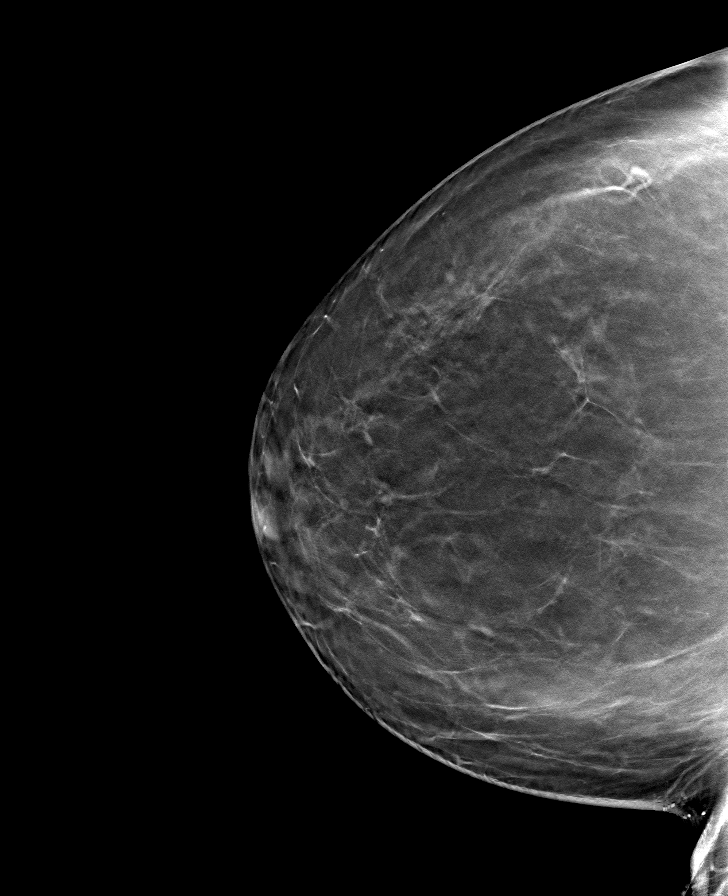

[8 of 24 positions shown; findings below may reference images not displayed]

ACR Breast Density Category b: There are scattered areas of
fibroglandular density.
FINDINGS: There are no findings suspicious for malignancy. Images were
processed with CAD.
IMPRESSION: No mammographic evidence of malignancy. A result letter of this
screening mammogram will be mailed directly to the patient.

RECOMMENDATION:
Screening mammogram in one year. (Code:CN-U-775)

BI-RADS CATEGORY  1: Negative.

## 2021-09-09 ENCOUNTER — Other Ambulatory Visit (INDEPENDENT_AMBULATORY_CARE_PROVIDER_SITE_OTHER): Payer: BC Managed Care – PPO

## 2021-09-09 DIAGNOSIS — E785 Hyperlipidemia, unspecified: Secondary | ICD-10-CM | POA: Diagnosis not present

## 2021-09-09 LAB — LIPID PANEL
Cholesterol: 164 mg/dL (ref 0–200)
HDL: 70.5 mg/dL (ref >39.00)
LDL Cholesterol: 81 mg/dL (ref 0–99)
NonHDL: 93.73
Total CHOL/HDL Ratio: 2
Triglycerides: 63 mg/dL (ref 0.0–149.0)
VLDL: 12.6 mg/dL (ref 0.0–40.0)

## 2021-10-06 ENCOUNTER — Encounter: Payer: Self-pay | Admitting: Family Medicine

## 2021-10-06 ENCOUNTER — Ambulatory Visit (INDEPENDENT_AMBULATORY_CARE_PROVIDER_SITE_OTHER): Payer: BC Managed Care – PPO | Admitting: Family Medicine

## 2021-10-06 ENCOUNTER — Other Ambulatory Visit: Payer: Self-pay

## 2021-10-06 VITALS — BP 130/86 | HR 64 | Temp 98.3°F | Ht 66.0 in | Wt 218.4 lb

## 2021-10-06 DIAGNOSIS — M5412 Radiculopathy, cervical region: Secondary | ICD-10-CM | POA: Diagnosis not present

## 2021-10-06 DIAGNOSIS — M79602 Pain in left arm: Secondary | ICD-10-CM

## 2021-10-06 DIAGNOSIS — E785 Hyperlipidemia, unspecified: Secondary | ICD-10-CM

## 2021-10-06 MED ORDER — PREDNISONE 20 MG PO TABS: ORAL_TABLET | ORAL | 0 refills | Status: DC

## 2021-10-06 MED ORDER — ROSUVASTATIN CALCIUM 5 MG PO TABS: 5.0000 mg | ORAL_TABLET | Freq: Every day | ORAL | 2 refills | Status: DC

## 2021-10-06 NOTE — Progress Notes (Signed)
Chidiebere Wynn T. Dorota Heinrichs, MD, CAQ Sports Medicine [facility] at [facility] [address] [place], [address]  Phone: [phone]  FAX: [phone]  Tara Marshall - 61 y.o. female  MRN [id number]  Date of Birth: 06-May-1960  Date: 10/06/2021  PCP: Doreene Nest, NP  Referral: Doreene Nest, NP  Chief Complaint  Patient presents with   Arm Pain    Left   Numbness    Left Hand   Subjective:   Tara Marshall is a 61 y.o. very pleasant female patient with Body mass index is 35.25 kg/m. who presents with the following:  L arm pain and hand numbness: ongoing for 6 months.   This is a pleasant left-hand-dominant female who presents with some left-sided arm pain, radicular pain down the arm, some numbness distally as well as some pain in the posterior aspect of the neck as well as the shoulder blade region.  She also has tenderness in and about the parascapular musculature.  She has greater complaints of some deep ache in the wrist, as well, and to a lesser extent in the fingers.  She has not had any specific injury at all in regards to her shoulder or neck.  While she is stiff, she is able to achieve full active range of motion of the shoulder.  Full range of motion at the elbow.  She has never had any significant prior neck injuries, procedures, or any significant shoulder or arm fracture.  She does have a distant history of some carpal tunnel.  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     Review of Systems is noted in the HPI, as appropriate  Objective:   BP 130/86   Pulse 64   Temp 98.3 F (36.8 C) (Oral)   Ht 5\' 6"  (1.676 m)   Wt 218 lb 6 oz (99.1 kg)   SpO2 99%   BMI 35.25 kg/m   GEN: No acute distress; alert,appropriate. PULM: Breathing comfortably in no respiratory distress PSYCH: Normally interactive.   CERVICAL SPINE EXAM Range of motion: Flexion, extension, lateral bending, and rotation: Roughly 20%  loss of motion in all directions some pain with terminal movements. Pain with terminal motion: Yes Spinous Processes: NT SCM: NT Upper paracervical muscles: Diffusely tender, more on the left Upper traps: Diffusely tender in the trap as well as in the remainder of the parascapular musculature. C5-T1 intact, sensation and motor   At the shoulder, I am ultimately able to achieve full range of motion.  She does guard with motion at the shoulder to a degree.  With distraction and she is able to achieve full active motion. Strength is 5/5, and external rotation is 4+/5 and does provoke some pain. , and Neer testing are all negative.  Grip and sensation are intact  Laboratory and Imaging Data:  Assessment and Plan:     ICD-10-CM   1. Cervical radiculopathy, acute  M54.12 Ambulatory referral to Physical Therapy    2. Left arm pain  M79.602 Ambulatory referral to Physical Therapy     Referred pain from the neck to the shoulder as well as the entirety of the arm.  Suspect left-sided nerve encroachment, foraminal stenosis most likely, cannot exclude spinal stenosis, and suspected multilevel degenerative disc disease.  I do not think that this is primary intra-articular pathology, though she does guard some and that her entire parascapular musculature is weak and tender to palpation.  Think that  she would do well with some functional physical therapy.  Additional I am going to give the patient 10 days of some steroids to take.  Medication Management during today's office visit: Meds ordered this encounter  Medications   predniSONE (DELTASONE) 20 MG tablet    Sig: 2 tabs po daily for 5 days, then 1 tab po daily for 5 days    Dispense:  15 tablet    Refill:  0   There are no discontinued medications.  Orders placed today for conditions managed today: Orders Placed This Encounter  Procedures   Ambulatory referral to Physical Therapy    Disposition: No follow-ups  on file.  Dr[employer] One speech-to-text software was used for transcription in this dictation.  Possible transcriptional errors can occur using Animal nutritionist.   Signed,  Elpidio Galea. Yarah Fuente, MD   Outpatient Encounter Medications as of 10/06/2021  Medication Sig   cetirizine (ZYRTEC) 10 MG tablet Take 1 tablet (10 mg total) by mouth at bedtime. For allergies.   fluticasone (FLONASE) 50 MCG/ACT nasal spray Place 1 spray into both nostrils 2 (two) times daily.   hydrochlorothiazide (HYDRODIURIL) 12.5 MG tablet Take 1 tablet by mouth once daily for blood pressure   predniSONE (DELTASONE) 20 MG tablet 2 tabs po daily for 5 days, then 1 tab po daily for 5 days   [DISCONTINUED] rosuvastatin (CRESTOR) 5 MG tablet Take 1 tablet (5 mg total) by mouth daily. For cholesterol.   No facility-administered encounter medications on file as of 10/06/2021.

## 2021-10-06 NOTE — Telephone Encounter (Signed)
Looks like you sent in HCTZ on 7/19 with refills but the Crestor is due.

## 2021-11-16 NOTE — Progress Notes (Unsigned)
    Tara Marshall T. Jamirra Curnow, MD, [place] at [facility] [place], [address]  Phone: [phone]  FAX: [phone]  Tara Marshall - 61 y.o. female  MRN [id number]  Date of Birth: 06-10-1960  Date: 11/17/2021  PCP: Pleas Koch, NP  Referral: Pleas Koch, NP  No chief complaint on file.  Subjective:   Tara Marshall is a 61 y.o. very pleasant female patient with There is no height or weight on file to calculate BMI. who presents with the following:  I remember this patient well, having seen her 6 weeks ago for neck pain with left-sided radiculopathy.  She was having some pain as well as some numbness distally in the arm.  She was also having some pain in the shoulder blade region and in the diffuse paraspinous musculature.  She was also having a lot of parascapular muscular tenderness.  Last time she was here, I did give her course of some steroids to take, and also send her for formal physical therapy.  She is here to follow-up today.    10/06/2021 Last OV with Owens Loffler, MD  L arm pain and hand numbness: ongoing for 6 months.    This is a pleasant left-hand-dominant female who presents with some left-sided arm pain, radicular pain down the arm, some numbness distally as well as some pain in the posterior aspect of the neck as well as the shoulder blade region.  She also has tenderness in and about the parascapular musculature.   She has greater complaints of some deep ache in the wrist, as well, and to a lesser extent in the fingers.   She has not had any specific injury at all in regards to her shoulder or neck.  While she is stiff, she is able to achieve full active range of motion of the shoulder.   Full range of motion at the elbow.   She has never had any significant prior neck injuries, procedures, or any significant shoulder or arm fracture.  She does have a distant history of some carpal tunnel.    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   Review of Systems is noted in the HPI, as appropriate  Objective:   There were no vitals taken for this visit.  GEN: No acute distress; alert,appropriate. PULM: Breathing comfortably in no respiratory distress PSYCH: Normally interactive.   Laboratory and Imaging Data:  Assessment and Plan:   ***

## 2021-11-17 ENCOUNTER — Ambulatory Visit (INDEPENDENT_AMBULATORY_CARE_PROVIDER_SITE_OTHER): Payer: BC Managed Care – PPO | Admitting: Family Medicine

## 2021-11-17 ENCOUNTER — Encounter: Payer: Self-pay | Admitting: Family Medicine

## 2021-11-17 VITALS — BP 120/80 | HR 66 | Temp 98.6°F | Ht 66.0 in | Wt 218.2 lb

## 2021-11-17 DIAGNOSIS — M19032 Primary osteoarthritis, left wrist: Secondary | ICD-10-CM

## 2021-11-17 DIAGNOSIS — M79602 Pain in left arm: Secondary | ICD-10-CM

## 2021-11-17 DIAGNOSIS — M5412 Radiculopathy, cervical region: Secondary | ICD-10-CM | POA: Diagnosis not present

## 2021-12-08 ENCOUNTER — Other Ambulatory Visit: Payer: Self-pay | Admitting: Primary Care

## 2021-12-08 DIAGNOSIS — Z1231 Encounter for screening mammogram for malignant neoplasm of breast: Secondary | ICD-10-CM

## 2022-01-13 ENCOUNTER — Ambulatory Visit
Admission: RE | Admit: 2022-01-13 | Discharge: 2022-01-13 | Disposition: A | Discharge status: Home / Self Care | Payer: BC Managed Care – PPO | Source: Ambulatory Visit | Attending: Primary Care | Admitting: Primary Care

## 2022-01-13 DIAGNOSIS — Z1231 Encounter for screening mammogram for malignant neoplasm of breast: Secondary | ICD-10-CM | POA: Insufficient documentation

## 2022-02-10 ENCOUNTER — Telehealth: Payer: Self-pay | Admitting: Primary Care

## 2022-02-10 ENCOUNTER — Other Ambulatory Visit: Payer: Self-pay | Admitting: Primary Care

## 2022-02-10 DIAGNOSIS — E785 Hyperlipidemia, unspecified: Secondary | ICD-10-CM

## 2022-02-10 NOTE — Telephone Encounter (Signed)
See refill request received by pharmacy.

## 2022-02-10 NOTE — Telephone Encounter (Signed)
Called and spoke with patient she states she is no longer using [facility] and would like it switched over to [facility] on [address] in [place].

## 2022-02-10 NOTE — Telephone Encounter (Signed)
  Encourage patient to contact the pharmacy for refills or they can request refills through [facility]  Did the patient contact the pharmacy:  yes   LAST APPOINTMENT DATE:  Please schedule appointment if longer than 1 year 11/17/2021 NEXT APPOINTMENT DATE:  MEDICATION:rosuvastatin rosuvastatin (CRESTOR) 5 MG tablet  Is the patient out of medication? no  If not, how much is left?3 left  Is this a 90 day supply: no  PHARMACY: [facility], [place] Phone: [phone]  Fax: [phone]      Let patient know to contact pharmacy at the end of the day to make sure medication is ready.  Please notify patient to allow 48-72 hours to process

## 2022-02-10 NOTE — Telephone Encounter (Signed)
Received refill request from [facility] for rosuvastatin cholesterol medication. I sent a 9 month supply of this medication to [facility] in August 2023.   Is she still using [facility] pharmacy?

## 2022-02-10 NOTE — Telephone Encounter (Signed)
Patient called back in, and stated she would like [place] to hold off on sending the [facility] till she can verify how much it will be to pick it up from there. She may try and get back with [facility] if it will cost her too much.  She will call back if she wants [place] to send it to [facility].

## 2022-06-17 ENCOUNTER — Telehealth: Payer: Self-pay

## 2022-06-17 NOTE — Telephone Encounter (Signed)
[facility] Night - Client Nonclinical Telephone Record  [facility] Client [facility] Night - Client Client Site [facility] - Night Provider Vernona Rieger - NP Contact Type Call Who Is Calling Patient / Member / Family / Caregiver Caller Name Aneres Muhl Caller Phone Number [phone] Patient Name Tara Marshall Patient DOB [birth year] Call Type Message Only Information Provided Reason for Call Request for General Office Information Initial Comment Caller states that she is calling because she has an appointment tomorrow. It is at 830AM but they are calling for bad weather. If the weather is bad she is not sure if she will be able to make it. She doesn't want to put it off but is concerned. Additional Comment Caller states that she can't get good service on the line that the office calls. She left her home number to try and request a call back to discuss what she should do about tomorrow's appointment. Disp. Time Disposition Final User 06/17/2022 1:57:32 PM General Information Provided Yes Cherylynn Ridges Call Closed By: Cherylynn Ridges Transaction Date/Time: 06/17/2022 1:53:23 PM (ET

## 2022-06-17 NOTE — Telephone Encounter (Signed)
Spoke to patient and got them rescheduled.

## 2022-06-17 NOTE — Telephone Encounter (Signed)
Sending note to [facility]sc support. 

## 2022-06-18 ENCOUNTER — Encounter: Payer: BC Managed Care – PPO | Admitting: Primary Care

## 2022-06-19 ENCOUNTER — Ambulatory Visit (INDEPENDENT_AMBULATORY_CARE_PROVIDER_SITE_OTHER): Payer: BC Managed Care – PPO | Admitting: Primary Care

## 2022-06-19 ENCOUNTER — Encounter: Payer: Self-pay | Admitting: Primary Care

## 2022-06-19 VITALS — BP 122/86 | HR 85 | Temp 97.5°F | Ht 66.0 in | Wt 218.0 lb

## 2022-06-19 DIAGNOSIS — E785 Hyperlipidemia, unspecified: Secondary | ICD-10-CM | POA: Diagnosis not present

## 2022-06-19 DIAGNOSIS — J302 Other seasonal allergic rhinitis: Secondary | ICD-10-CM

## 2022-06-19 DIAGNOSIS — Z0000 Encounter for general adult medical examination without abnormal findings: Secondary | ICD-10-CM | POA: Diagnosis not present

## 2022-06-19 DIAGNOSIS — I10 Essential (primary) hypertension: Secondary | ICD-10-CM | POA: Diagnosis not present

## 2022-06-19 DIAGNOSIS — R519 Headache, unspecified: Secondary | ICD-10-CM

## 2022-06-19 DIAGNOSIS — Z1211 Encounter for screening for malignant neoplasm of colon: Secondary | ICD-10-CM

## 2022-06-19 DIAGNOSIS — R7303 Prediabetes: Secondary | ICD-10-CM | POA: Diagnosis not present

## 2022-06-19 LAB — COMPREHENSIVE METABOLIC PANEL
ALT: 14 U/L (ref 0–35)
AST: 18 U/L (ref 0–37)
Albumin: 4.3 g/dL (ref 3.5–5.2)
Alkaline Phosphatase: 64 U/L (ref 39–117)
BUN: 11 mg/dL (ref 6–23)
CO2: 31 mEq/L (ref 19–32)
Calcium: 9.6 mg/dL (ref 8.4–10.5)
Chloride: 101 mEq/L (ref 96–112)
Creatinine, Ser: 0.78 mg/dL (ref 0.40–1.20)
GFR: 80.4 mL/min (ref >60.00)
Glucose, Bld: 82 mg/dL (ref 70–99)
Potassium: 3.5 mEq/L (ref 3.5–5.1)
Sodium: 141 mEq/L (ref 135–145)
Total Bilirubin: 0.7 mg/dL (ref 0.2–1.2)
Total Protein: 7.4 g/dL (ref 6.0–8.3)

## 2022-06-19 LAB — LIPID PANEL
Cholesterol: 156 mg/dL (ref 0–200)
HDL: 71.5 mg/dL (ref >39.00)
LDL Cholesterol: 73 mg/dL (ref 0–99)
NonHDL: 84.33
Total CHOL/HDL Ratio: 2
Triglycerides: 56 mg/dL (ref 0.0–149.0)
VLDL: 11.2 mg/dL (ref 0.0–40.0)

## 2022-06-19 LAB — HEMOGLOBIN A1C: Hgb A1c MFr Bld: 6.1 % (ref 4.6–6.5)

## 2022-06-19 NOTE — Assessment & Plan Note (Signed)
Repeat A1C pending.  Discussed the importance of a healthy diet and regular exercise in order for weight loss, and to reduce the risk of further co-morbidity.  

## 2022-06-19 NOTE — Assessment & Plan Note (Signed)
Overall stable.  Continue Claritin 10 mg daily as most headaches come during allergy season.  No concerns today.

## 2022-06-19 NOTE — Progress Notes (Signed)
Subjective:    Patient ID: Tara Marshall, female    DOB: 09-Jan-1961, 62 y.o.   MRN: [id number]  HPI  Tara Marshall is a very pleasant 62 y.o. female who presents today for complete physical and follow up of chronic conditions.   Immunizations: -Tetanus: Completed in 2022  -Shingles: Never completed, declines today  Diet: Fair diet.  Exercise: She is walking some.  Eye exam: Completed years ago Dental exam: Completes semi-annually    Pap Smear: 2023 Mammogram: December 2023  Colonoscopy: Completed in 2014, due now.    BP Readings from Last 3 Encounters:  06/19/22 122/86  11/17/21 120/80  10/06/21 130/86   Wt Readings from Last 3 Encounters:  06/19/22 218 lb (98.9 kg)  11/17/21 218 lb 4 oz (99 kg)  10/06/21 218 lb 6 oz (99.1 kg)      Review of Systems  Constitutional:  Negative for unexpected weight change.  HENT:  Negative for rhinorrhea.   Respiratory:  Negative for cough and shortness of breath.   Cardiovascular:  Negative for chest pain.  Gastrointestinal:  Negative for constipation and diarrhea.  Genitourinary:  Negative for difficulty urinating.  Musculoskeletal:  Positive for arthralgias.  Skin:  Negative for rash.  Allergic/Immunologic: Positive for environmental allergies.  Neurological:  Negative for dizziness and headaches.  Psychiatric/Behavioral:  Positive for sleep disturbance. The patient is nervous/anxious.          Past Medical History:  Diagnosis Date   Arthritis    Carotid bruit    right    Chicken pox    Hypertension    Lightheadedness 05/09/2019   Migraine    Other ill-defined heart disease     Social History   Socioeconomic History   Marital status: Married    Spouse name: Not on file   Number of children: Not on file   Years of education: Not on file   Highest education level: Not on file  Occupational History   Not on file  Tobacco Use   Smoking status: Never   Smokeless tobacco: Never  Substance and Sexual  Activity   Alcohol use: Yes    Alcohol/week: 1.0 standard drink of alcohol    Types: 1 Standard drinks or equivalent per week   Drug use: No   Sexual activity: Not on file  Other Topics Concern   Not on file  Social History Narrative   Married.   3 children   Highest level of education 12th grade.   Works as a Arts development officer.   Enjoys playing on the computer, walking, organizing.   Social Determinants of Health   Financial Resource Strain: Not on file  Food Insecurity: Not on file  Transportation Needs: Not on file  Physical Activity: Not on file  Stress: Not on file  Social Connections: Not on file  Intimate Partner Violence: Not on file    Past Surgical History:  Procedure Laterality Date   BREAST CYST ASPIRATION     at age 23, possibly left breast   CESAREAN SECTION     TUBAL LIGATION      Family History  Problem Relation Age of Onset   Heart attack Mother    Hypertension Mother     No Known Allergies  Current Outpatient Medications on File Prior to Visit  Medication Sig Dispense Refill   cetirizine (ZYRTEC) 10 MG tablet Take 1 tablet (10 mg total) by mouth at bedtime. For allergies. 90 tablet 0   fluticasone (FLONASE) 50 MCG/ACT  nasal spray Place 1 spray into both nostrils 2 (two) times daily. 16 g 0   hydrochlorothiazide (HYDRODIURIL) 12.5 MG tablet Take 1 tablet by mouth once daily for blood pressure 90 tablet 2   rosuvastatin (CRESTOR) 5 MG tablet Take 1 tablet (5 mg total) by mouth daily. For cholesterol. 90 tablet 2   No current facility-administered medications on file prior to visit.    BP 122/86   Pulse 85   Temp (!) 97.5 F (36.4 C) (Temporal)   Ht 5\' 6"  (1.676 m)   Wt 218 lb (98.9 kg)   SpO2 98%   BMI 35.19 kg/m  Objective:   Physical Exam HENT:     Right Ear: Tympanic membrane and ear canal normal.     Left Ear: Tympanic membrane and ear canal normal.     Nose: Nose normal.  Eyes:     Conjunctiva/sclera: Conjunctivae normal.     Pupils:  Pupils are equal, round, and reactive to light.  Neck:     Thyroid: No thyromegaly.  Cardiovascular:     Rate and Rhythm: Normal rate and regular rhythm.     Heart sounds: No murmur heard. Pulmonary:     Effort: Pulmonary effort is normal.     Breath sounds: Normal breath sounds. No rales.  Abdominal:     General: Bowel sounds are normal.     Palpations: Abdomen is soft.     Tenderness: There is no abdominal tenderness.  Musculoskeletal:        General: Normal range of motion.     Cervical back: Neck supple.  Lymphadenopathy:     Cervical: No cervical adenopathy.  Skin:    General: Skin is warm and dry.     Findings: No rash.  Neurological:     Mental Status: She is alert and oriented to person, place, and time.     Cranial Nerves: No cranial nerve deficit.     Deep Tendon Reflexes: Reflexes are normal and symmetric.  Psychiatric:        Mood and Affect: Mood normal.           Assessment & Plan:  Preventative health care Assessment & Plan: Declines Shingrix vaccines. Pap smear UTD. Mammogram UTD Colonoscopy due, referral placed to GI  Discussed the importance of a healthy diet and regular exercise in order for weight loss, and to reduce the risk of further co-morbidity.  Exam stable. Labs pending.  Follow up in 1 year for repeat physical.    Screening for colon cancer -     Ambulatory referral to Gastroenterology  Essential hypertension Assessment & Plan: Controlled.  Continue HCTZ 12.5 mg daily. CMP pending.   Hyperlipidemia, unspecified hyperlipidemia type Assessment & Plan: Repeat lipid panel pending.  Discussed the importance of a healthy diet and regular exercise in order for weight loss, and to reduce the risk of further co-morbidity. Continue rosuvastatin 5 mg daily.  Orders: -     Lipid panel -     Comprehensive metabolic panel  Prediabetes Assessment & Plan: Repeat A1C pending.  Discussed the importance of a healthy diet and  regular exercise in order for weight loss, and to reduce the risk of further co-morbidity.   Orders: -     Hemoglobin A1c  Frequent headaches Assessment & Plan: Overall stable.  Continue Claritin 10 mg daily as most headaches come during allergy season.  No concerns today.   Seasonal allergies Assessment & Plan: Waxes and wanes.  Continue Claritin 10 mg  daily. Continue Flonase PRN.         Doreene Nest, NP

## 2022-06-19 NOTE — Assessment & Plan Note (Signed)
Waxes and wanes.  Continue Claritin 10 mg daily. Continue Flonase PRN.

## 2022-06-19 NOTE — Assessment & Plan Note (Signed)
Declines Shingrix vaccines. Pap smear UTD. Mammogram UTD Colonoscopy due, referral placed to GI  Discussed the importance of a healthy diet and regular exercise in order for weight loss, and to reduce the risk of further co-morbidity.  Exam stable. Labs pending.  Follow up in 1 year for repeat physical.  

## 2022-06-19 NOTE — Assessment & Plan Note (Signed)
Controlled.   Continue HCTZ 12.5 mg daily. CMP pending. 

## 2022-06-19 NOTE — Assessment & Plan Note (Signed)
Repeat lipid panel pending.  Discussed the importance of a healthy diet and regular exercise in order for weight loss, and to reduce the risk of further co-morbidity. Continue rosuvastatin 5 mg daily. 

## 2022-06-19 NOTE — Patient Instructions (Signed)
You will either be contacted via phone regarding your referral to GI for the colonoscopy, or you may receive a letter on your MyChart portal from our referral team with instructions for scheduling an appointment. Please let us know if you have not been contacted by anyone within two weeks.  Stop by the lab prior to leaving today. I will notify you of your results once received.   It was a pleasure to see you today!

## 2022-06-22 ENCOUNTER — Encounter: Payer: Self-pay | Admitting: *Deleted

## 2022-07-15 ENCOUNTER — Encounter: Payer: BC Managed Care – PPO | Admitting: Primary Care

## 2022-09-21 ENCOUNTER — Other Ambulatory Visit: Payer: Self-pay | Admitting: Primary Care

## 2022-09-21 DIAGNOSIS — I10 Essential (primary) hypertension: Secondary | ICD-10-CM

## 2022-12-14 ENCOUNTER — Other Ambulatory Visit: Payer: Self-pay | Admitting: Primary Care

## 2022-12-14 DIAGNOSIS — Z1231 Encounter for screening mammogram for malignant neoplasm of breast: Secondary | ICD-10-CM

## 2023-01-18 ENCOUNTER — Telehealth: Payer: Self-pay | Admitting: Primary Care

## 2023-01-18 NOTE — Telephone Encounter (Signed)
Pt called in and stated that she would like for [facility] assistant to give her a call back if possible I ask could I help her with anything patient said no just please have her call me

## 2023-01-19 ENCOUNTER — Ambulatory Visit
Admission: RE | Admit: 2023-01-19 | Discharge: 2023-01-19 | Disposition: A | Discharge status: Home / Self Care | Payer: BC Managed Care – PPO | Source: Ambulatory Visit | Attending: Primary Care | Admitting: Primary Care

## 2023-01-19 DIAGNOSIS — Z1231 Encounter for screening mammogram for malignant neoplasm of breast: Secondary | ICD-10-CM | POA: Diagnosis present

## 2023-01-19 NOTE — Telephone Encounter (Signed)
Unable to reach patient. Unable to leave voicemail. Mailbox not set up.

## 2023-01-19 NOTE — Telephone Encounter (Signed)
Pt called back returning Kelli's call. Pt states she has discussed with Chestine Spore about searching for the exact year she last had a colonoscopy. Pt states she believed her last exam was in 2016, but she believes now it was in 2014. Pt states she believes it was in 2014, because she remembered having her exam a year before her husband, has his. Pt states she found some old ppw from her husband's exam in 2015, which lead her to remembering when she last had her's. Pt states she believes she is due for another colonoscopy & prefers to have it done in [place]. Pt requested a call back @ [phone]

## 2023-01-19 NOTE — Telephone Encounter (Signed)
Called and spoke with patient, per Isabella Stalling last office visit note we have documented patients last colonoscopy was 2014. Referral was placed to [facility] in May 2024, information given to patient to call and scheduled repeat colonoscopy.

## 2023-01-27 ENCOUNTER — Ambulatory Visit: Payer: Self-pay | Admitting: Primary Care

## 2023-01-27 NOTE — Telephone Encounter (Signed)
Unable to reach patient. Unable to leave voicemail. Mailbox was not set up

## 2023-01-27 NOTE — Telephone Encounter (Signed)
Copied from CRM [id number]. Topic: Clinical - Medication Question >> Jan 27, 2023  8:19 AM Elizebeth Brooking wrote: Reason for CRM: patient has possibly hemmorids want to know what to do while she wits on appointment   Chief Complaint: rectal burning Symptoms: dryness  Frequency: ongoing since 12/7 when laying down or driving  Pertinent Negatives: Patient denies bleeding or itching Disposition: [] ED /[] Urgent Care (no appt availability in office) / [] Appointment(In office/virtual)/ [x]  [facility]/ [] Home Care/ [] Refused Recommended Disposition /[] [facility]/ []  Follow-up with PCP Additional Notes: The patient reported a 5/10 rectal burning sensation that has been ongoing since 01/16/2023. She said it feels similar to when you burn your hand on the stove.  It is tender. She reported that she is able to do normal activities during the day but when she sits down driving and when she lays down at night the burning starts.  At times it feels dry to the point that it hurts to wipe and other times it feels very moist.  She did not see any redness and she denies any bleeding.  She has had diarrhea due to an apple cider vinegar and honey drink.  She reported that acetaminophen is the only thing that gives her relief.   She used vaseline and used preparation H wipes.  The patient preferred to be seen by a female provider.    Reason for Disposition  [1] Home treatment > 3 days for rectal pain AND [2] not improved  Answer Assessment - Initial Assessment Questions 1. SYMPTOM:  "What's the main symptom you're concerned about?" (e.g., pain, itching, swelling, rash)     Burning sensation on cheeks and anus. I looked but didn't see anything  2. ONSET: "When did the burning/itching  start?"     01/16/2023 3. RECTAL PAIN: "Do you have any pain around your rectum?" "How bad is the pain?"  (Scale 0-10; or mild, moderate, severe)   - NONE (0): no pain   - MILD (1-3): doesn't interfere with normal  activities    - MODERATE (4-7): interferes with normal activities or awakens from sleep, limping    - SEVERE (8-10): excruciating pain, unable to have a bowel movement      5 - not hurting but really uncomfortable; feels like you would if you burned yourself, more tender  4. RECTAL ITCHING: "Do you have any itching in this area?" "How bad is the itching?"  (Scale 0-10; or mild, moderate, severe)   - NONE: no itching   - MILD: doesn't interfere with normal activities    - MODERATE-SEVERE: interferes with normal activities or awakens from sleep     No itching but the burning causes her to be unable to sleep at night and unable to sit  5. CONSTIPATION: "Do you have constipation?" If Yes, ask: "How bad is it?"     Diarrhea not constipation  6. CAUSE: "What do you think is causing the anus symptoms?"     I thought it was hemorrhoids but I'm unsure  7. OTHER SYMPTOMS: "Do you have any other symptoms?"  (e.g., abdomen pain, fever, rectal bleeding, vomiting)     Dryness at times and then sometimes feels super moist  Protocols used: Rectal Symptoms-A-AH

## 2023-01-27 NOTE — Telephone Encounter (Signed)
She can use soap and water to cleanse the area well. She can get some witch hazel wipes over the counter the help soothe the area and help with the itching until she is seen in office

## 2023-01-28 ENCOUNTER — Ambulatory Visit (INDEPENDENT_AMBULATORY_CARE_PROVIDER_SITE_OTHER): Payer: BC Managed Care – PPO | Admitting: Family

## 2023-01-28 ENCOUNTER — Encounter: Payer: Self-pay | Admitting: Family

## 2023-01-28 VITALS — BP 132/78 | HR 72 | Temp 98.4°F | Ht 66.0 in | Wt 225.0 lb

## 2023-01-28 DIAGNOSIS — R208 Other disturbances of skin sensation: Secondary | ICD-10-CM | POA: Diagnosis not present

## 2023-01-28 DIAGNOSIS — L309 Dermatitis, unspecified: Secondary | ICD-10-CM | POA: Diagnosis not present

## 2023-01-28 MED ORDER — NYSTATIN-TRIAMCINOLONE 100000-0.1 UNIT/GM-% EX OINT: 1.0000 | TOPICAL_OINTMENT | Freq: Two times a day (BID) | CUTANEOUS | 0 refills | Status: DC

## 2023-01-28 NOTE — Telephone Encounter (Signed)
Patient had appt today with Mort Sawyers, this was addressed at that visit.

## 2023-01-28 NOTE — Progress Notes (Signed)
Established Patient Office Visit  Subjective:   Patient ID: Tara Marshall, female    DOB: 11-29-1960  Age: 62 y.o. MRN: [id number]  CC:  Chief Complaint  Patient presents with   Rectal burning    Started 12/7, rectal burning  No bleeding or itching     HPI: Tara Marshall is a 62 y.o. female presenting on 01/28/2023 for Rectal burning (Started 12/7, rectal burning /No bleeding or itching )  On her back side she feels a burning sensation and at times will be really dry back there, will feel as if something is 'running out' but then she checks and wipes with no discharge. She states seems to be increasing into her crack. At night time will exacerbate and hard to sleep due to burning discomfort. She takes tylenol with some improvement.  These symptoms started about ten days ago. She has changed her wipes that she typically uses (baby wipes) No rectal itching. She feels as though something is 'moving around' in her rectal area more so at night time.  No change in bowel movements.  No vaginal discharge.      ROS: Negative unless specifically indicated above in HPI.   Relevant past medical history reviewed and updated as indicated.   Allergies and medications reviewed and updated.   Current Outpatient Medications:    cetirizine (ZYRTEC) 10 MG tablet, Take 1 tablet (10 mg total) by mouth at bedtime. For allergies., Disp: 90 tablet, Rfl: 0   fluticasone (FLONASE) 50 MCG/ACT nasal spray, Place 1 spray into both nostrils 2 (two) times daily., Disp: 16 g, Rfl: 0   hydrochlorothiazide (HYDRODIURIL) 12.5 MG tablet, Take 1 tablet by mouth once daily for blood pressure, Disp: 90 tablet, Rfl: 2   nystatin-triamcinolone ointment (MYCOLOG), Apply 1 Application topically 2 (two) times daily., Disp: 30 g, Rfl: 0   rosuvastatin (CRESTOR) 5 MG tablet, Take 1 tablet (5 mg total) by mouth daily. For cholesterol., Disp: 90 tablet, Rfl: 2  No Known Allergies  Objective:   BP 132/78   Pulse 72    Temp 98.4 F (36.9 C) (Temporal)   Ht 5\' 6"  (1.676 m)   Wt 225 lb (102.1 kg)   SpO2 98%   BMI 36.32 kg/m    Physical Exam Constitutional:      General: She is not in acute distress.    Appearance: Normal appearance. She is normal weight. She is not ill-appearing, toxic-appearing or diaphoretic.  HENT:     Head: Normocephalic.  Cardiovascular:     Rate and Rhythm: Normal rate.  Pulmonary:     Effort: Pulmonary effort is normal.  Musculoskeletal:        General: Normal range of motion.  Skin:    Findings: Rash (bil inner buttocks with scaly annular like rash) present.  Neurological:     General: No focal deficit present.     Mental Status: She is alert and oriented to person, place, and time. Mental status is at baseline.  Psychiatric:        Mood and Affect: Mood normal.        Behavior: Behavior normal.        Thought Content: Thought content normal.        Judgment: Judgment normal.     Assessment & Plan:  Rectal burning Assessment & Plan: Worry for pinworms, although not high suspicion.  Ordering pin worms testing, pending results.    Orders: -     Nystatin-Triamcinolone; Apply 1 Application topically  2 (two) times daily.  Dispense: 30 g; Refill: 0 -     Pinworm prep; Future  Dermatitis Assessment & Plan: Ringworm vs contact dermatitis.  Advised pt to stop wearing her new pads and also to go back to original baby wipes prior to changing brands.  Giving rx for nystatin triamincinolone for area.  If no improvement please f/u with PCP   Orders: -     Nystatin-Triamcinolone; Apply 1 Application topically 2 (two) times daily.  Dispense: 30 g; Refill: 0 -     Pinworm prep; Future     Follow up plan: Return for f/u PCP if no improvement in symptoms.  Mort Sawyers, FNP

## 2023-01-29 ENCOUNTER — Other Ambulatory Visit: Payer: Self-pay | Admitting: Radiology

## 2023-01-29 DIAGNOSIS — R208 Other disturbances of skin sensation: Secondary | ICD-10-CM

## 2023-01-29 DIAGNOSIS — L309 Dermatitis, unspecified: Secondary | ICD-10-CM

## 2023-02-03 DIAGNOSIS — R208 Other disturbances of skin sensation: Secondary | ICD-10-CM

## 2023-02-03 DIAGNOSIS — L309 Dermatitis, unspecified: Secondary | ICD-10-CM | POA: Insufficient documentation

## 2023-02-03 HISTORY — DX: Other disturbances of skin sensation: R20.8

## 2023-02-03 NOTE — Assessment & Plan Note (Signed)
Worry for pinworms, although not high suspicion.  Ordering pin worms testing, pending results.

## 2023-02-03 NOTE — Assessment & Plan Note (Signed)
Ringworm vs contact dermatitis.  Advised pt to stop wearing her new pads and also to go back to original baby wipes prior to changing brands.  Giving rx for nystatin triamincinolone for area.  If no improvement please f/u with PCP

## 2023-02-04 ENCOUNTER — Encounter: Payer: Self-pay | Admitting: Family

## 2023-02-04 LAB — PINWORM PREP
MICRO NUMBER:: 15877091
RESULT:: NONE SEEN
SPECIMEN QUALITY:: ADEQUATE

## 2023-02-05 ENCOUNTER — Ambulatory Visit (INDEPENDENT_AMBULATORY_CARE_PROVIDER_SITE_OTHER): Payer: BC Managed Care – PPO | Admitting: Primary Care

## 2023-02-05 ENCOUNTER — Encounter: Payer: Self-pay | Admitting: Primary Care

## 2023-02-05 VITALS — BP 146/88 | HR 76 | Temp 97.2°F | Ht 66.0 in | Wt 225.0 lb

## 2023-02-05 DIAGNOSIS — E785 Hyperlipidemia, unspecified: Secondary | ICD-10-CM

## 2023-02-05 DIAGNOSIS — R208 Other disturbances of skin sensation: Secondary | ICD-10-CM

## 2023-02-05 DIAGNOSIS — I10 Essential (primary) hypertension: Secondary | ICD-10-CM

## 2023-02-05 MED ORDER — ROSUVASTATIN CALCIUM 5 MG PO TABS: 5.0000 mg | ORAL_TABLET | Freq: Every day | ORAL | 1 refills | Status: DC

## 2023-02-05 NOTE — Patient Instructions (Addendum)
Start taking famotidine (Pepcid) 20 mg every evening with dinner for heartburn.   Try taking your metamucil twice daily to prevent firm stools.   Keep up with good water intake.   Use the cream for now until symptoms resolve.   It was a pleasure to see you today!

## 2023-02-05 NOTE — Assessment & Plan Note (Signed)
Above goal.  Offered to change from hydrochlorothiazide to another agent. She declines. She will resume hydrochlorothiazide and update regarding symptoms.

## 2023-02-05 NOTE — Assessment & Plan Note (Signed)
Improved but continued.  Recommend she increase metamucil intake to twice daily for softer stools.  Add famotidine 20 mg with evening meal.  Continue increased water intake. Proceed with colonoscopy as scheduled.

## 2023-02-05 NOTE — Progress Notes (Signed)
Subjective:    Patient ID: Tara Marshall, female    DOB: [birth year], 62 y.o.   MRN: [id number]  HPI  Tara Marshall is a very pleasant 62 y.o. female with a history of hypertension, rectal burning, lower back pain, prediabetes, hyperlipidemia who presents today to discuss rectal burning.   Evaluated by Stephannie Peters, NP on 01/28/2023 for rectal burning that began 01/16/2023.  Also with sensation of rectal discharge but nothing is found upon examination.  During this visit she underwent pinworm testing which was negative.  She was prescribed nystatin-triamcinolone to rectal area.  Since her visit she's noticed improvement in her rectal burning, but she does notice minor "flares". Her "flares" only occur at night in bed after she has a bowel movement.   She typical has bowel movements twice daily. She takes metamucil every morning, has no rectal burning. She also has esophageal burning intermittently. She denies rectal bleeding, rectal itching, rectal discharge. She began increasing her intake of water.   She is also needing a refill of rosuvastatin. She stopped taking hydrochlorothiazide due to cramping.   She has a colonoscopy scheduled for early February 2025.  BP Readings from Last 3 Encounters:  02/05/23 (!) 146/88  01/28/23 132/78  06/19/22 122/86      Review of Systems  Constitutional:  Negative for unexpected weight change.  Respiratory:  Negative for shortness of breath.   Cardiovascular:  Negative for chest pain.  Gastrointestinal:  Positive for rectal pain. Negative for abdominal pain, blood in stool, constipation, diarrhea, nausea and vomiting.       Esophageal burning          Past Medical History:  Diagnosis Date   Arthritis    Carotid bruit    right    Chicken pox    Hypertension    Lightheadedness 05/09/2019   Migraine    Other ill-defined heart disease     Social History   Socioeconomic History   Marital status: Married    Spouse name: Not on file    Number of children: Not on file   Years of education: Not on file   Highest education level: Not on file  Occupational History   Not on file  Tobacco Use   Smoking status: Never   Smokeless tobacco: Never  Substance and Sexual Activity   Alcohol use: Yes    Alcohol/week: 1.0 standard drink of alcohol    Types: 1 Standard drinks or equivalent per week   Drug use: No   Sexual activity: Not on file  Other Topics Concern   Not on file  Social History Narrative   Married.   3 children   Highest level of education 12th grade.   Works as a Arts development officer.   Enjoys playing on the computer, walking, organizing.   Social Drivers of Corporate investment banker Strain: Not on file  Food Insecurity: Not on file  Transportation Needs: Not on file  Physical Activity: Not on file  Stress: Not on file  Social Connections: Not on file  Intimate Partner Violence: Not on file    Past Surgical History:  Procedure Laterality Date   BREAST CYST ASPIRATION     at age 62, possibly left breast   CESAREAN SECTION     TUBAL LIGATION      Family History  Problem Relation Age of Onset   Heart attack Mother    Hypertension Mother     No Known Allergies  Current Outpatient Medications on  File Prior to Visit  Medication Sig Dispense Refill   cetirizine (ZYRTEC) 10 MG tablet Take 1 tablet (10 mg total) by mouth at bedtime. For allergies. 90 tablet 0   fluticasone (FLONASE) 50 MCG/ACT nasal spray Place 1 spray into both nostrils 2 (two) times daily. 16 g 0   nystatin-triamcinolone ointment (MYCOLOG) Apply 1 Application topically 2 (two) times daily. 30 g 0   hydrochlorothiazide (HYDRODIURIL) 12.5 MG tablet Take 1 tablet by mouth once daily for blood pressure (Patient not taking: Reported on 02/05/2023) 90 tablet 2   No current facility-administered medications on file prior to visit.    BP (!) 146/88   Pulse 76   Temp (!) 97.2 F (36.2 C) (Temporal)   Ht 5\' 6"  (1.676 m)   Wt 225 lb (102.1  kg)   SpO2 99%   BMI 36.32 kg/m  Objective:   Physical Exam Cardiovascular:     Rate and Rhythm: Normal rate and regular rhythm.  Pulmonary:     Effort: Pulmonary effort is normal.     Breath sounds: Normal breath sounds.  Genitourinary:    Rectum: Tenderness present. No mass, anal fissure, external hemorrhoid or internal hemorrhoid. Normal anal tone.  Musculoskeletal:     Cervical back: Neck supple.  Skin:    General: Skin is warm and dry.  Neurological:     Mental Status: She is alert and oriented to person, place, and time.  Psychiatric:        Mood and Affect: Mood normal.           Assessment & Plan:  Rectal burning Assessment & Plan: Improved but continued.  Recommend she increase metamucil intake to twice daily for softer stools.  Add famotidine 20 mg with evening meal.  Continue increased water intake. Proceed with colonoscopy as scheduled.    Hyperlipidemia, unspecified hyperlipidemia type -     Rosuvastatin Calcium; Take 1 tablet (5 mg total) by mouth daily. For cholesterol.  Dispense: 90 tablet; Refill: 1  Essential hypertension Assessment & Plan: Above goal.  Offered to change from hydrochlorothiazide to another agent. She declines. She will resume hydrochlorothiazide and update regarding symptoms.           Doreene Nest, NP

## 2023-07-13 ENCOUNTER — Ambulatory Visit (INDEPENDENT_AMBULATORY_CARE_PROVIDER_SITE_OTHER): Payer: BC Managed Care – PPO | Admitting: Primary Care

## 2023-07-13 ENCOUNTER — Encounter: Payer: Self-pay | Admitting: Primary Care

## 2023-07-13 ENCOUNTER — Ambulatory Visit: Payer: Self-pay | Admitting: Primary Care

## 2023-07-13 VITALS — BP 128/74 | HR 69 | Temp 97.2°F | Ht 66.0 in | Wt 230.0 lb

## 2023-07-13 DIAGNOSIS — E2839 Other primary ovarian failure: Secondary | ICD-10-CM | POA: Diagnosis not present

## 2023-07-13 DIAGNOSIS — R7303 Prediabetes: Secondary | ICD-10-CM

## 2023-07-13 DIAGNOSIS — I10 Essential (primary) hypertension: Secondary | ICD-10-CM

## 2023-07-13 DIAGNOSIS — E785 Hyperlipidemia, unspecified: Secondary | ICD-10-CM

## 2023-07-13 DIAGNOSIS — Z0000 Encounter for general adult medical examination without abnormal findings: Secondary | ICD-10-CM | POA: Diagnosis not present

## 2023-07-13 LAB — HEMOGLOBIN A1C: Hgb A1c MFr Bld: 5.8 % (ref 4.6–6.5)

## 2023-07-13 LAB — COMPREHENSIVE METABOLIC PANEL WITH GFR
ALT: 11 U/L (ref 0–35)
AST: 13 U/L (ref 0–37)
Albumin: 4.4 g/dL (ref 3.5–5.2)
Alkaline Phosphatase: 65 U/L (ref 39–117)
BUN: 10 mg/dL (ref 6–23)
CO2: 30 meq/L (ref 19–32)
Calcium: 9.3 mg/dL (ref 8.4–10.5)
Chloride: 105 meq/L (ref 96–112)
Creatinine, Ser: 0.66 mg/dL (ref 0.40–1.20)
GFR: 92.16 mL/min (ref >60.00)
Glucose, Bld: 87 mg/dL (ref 70–99)
Potassium: 3.5 meq/L (ref 3.5–5.1)
Sodium: 142 meq/L (ref 135–145)
Total Bilirubin: 0.6 mg/dL (ref 0.2–1.2)
Total Protein: 6.9 g/dL (ref 6.0–8.3)

## 2023-07-13 LAB — CBC
HCT: 36.4 % (ref 36.0–46.0)
Hemoglobin: 12 g/dL (ref 12.0–15.0)
MCHC: 33 g/dL (ref 30.0–36.0)
MCV: 84.2 fl (ref 78.0–100.0)
Platelets: 275 10*3/uL (ref 150.0–400.0)
RBC: 4.32 Mil/uL (ref 3.87–5.11)
RDW: 14.5 % (ref 11.5–15.5)
WBC: 6.5 10*3/uL (ref 4.0–10.5)

## 2023-07-13 LAB — LIPID PANEL
Cholesterol: 196 mg/dL (ref 0–200)
HDL: 66.3 mg/dL (ref >39.00)
LDL Cholesterol: 115 mg/dL — ABNORMAL HIGH (ref 0–99)
NonHDL: 129.28
Total CHOL/HDL Ratio: 3
Triglycerides: 72 mg/dL (ref 0.0–149.0)
VLDL: 14.4 mg/dL (ref 0.0–40.0)

## 2023-07-13 NOTE — Assessment & Plan Note (Signed)
 Declines Shingrix and Pneumonia vaccines.  Mammogram UTD. Bone density scan due, orders placed Colonoscopy UTD, due 2035  Discussed the importance of a healthy diet and regular exercise in order for weight loss, and to reduce the risk of further co-morbidity.  Exam stable. Labs pending.  Follow up in 1 year for repeat physical.

## 2023-07-13 NOTE — Assessment & Plan Note (Signed)
 Controlled.   She stopped her hydrochlorothiazide  12.5 mg 1 month ago. Remain off hydrochlorothiazide  12.5 mg daily for now. She will continue to monitor at home and will report if BP elevates. Instructions provided.

## 2023-07-13 NOTE — Assessment & Plan Note (Signed)
 Off rosuvastatin  for >6 months due to side effects of myalgia. Remain off.  Repeat lipid panel pending.  Discussed the importance of a healthy diet and regular exercise in order for weight loss, and to reduce the risk of further co-morbidity.

## 2023-07-13 NOTE — Assessment & Plan Note (Signed)
 Repeat A1C pending.

## 2023-07-13 NOTE — Progress Notes (Signed)
 Subjective:    Patient ID: Tara Marshall, female    DOB: 1960-09-14, 63 y.o.   MRN: [id number]  HPI  Tara Marshall is a very pleasant 63 y.o. female who presents today for complete physical and follow up of chronic conditions.  Immunizations: -Tetanus: Completed in 2022  -Shingles: Never completed Shingrix series, declines  -Pneumonia: Never completed, declines   Diet: Fair diet.  Exercise: Regular exercise with walking   Eye exam: Completes annually  Dental exam: Completes semi-annually    Pap Smear: Completed in 2023 Mammogram: Completed in December 2024 Bone Density Scan: Never completed   Colonoscopy: Completed in 2025, due 2035   BP Readings from Last 3 Encounters:  07/13/23 128/74  02/05/23 (!) 146/88  01/28/23 132/78   Wt Readings from Last 3 Encounters:  07/13/23 230 lb (104.3 kg)  02/05/23 225 lb (102.1 kg)  01/28/23 225 lb (102.1 kg)      Review of Systems  Constitutional:  Negative for unexpected weight change.  HENT:  Negative for rhinorrhea.   Respiratory:  Negative for cough and shortness of breath.   Cardiovascular:  Negative for chest pain.  Gastrointestinal:  Negative for constipation and diarrhea.  Genitourinary:  Negative for difficulty urinating and menstrual problem.  Musculoskeletal:  Negative for arthralgias and myalgias.  Skin:  Negative for rash.  Allergic/Immunologic: Negative for environmental allergies.  Neurological:  Negative for dizziness, numbness and headaches.  Psychiatric/Behavioral:  The patient is not nervous/anxious.          Past Medical History:  Diagnosis Date   Arthritis    Carotid bruit    right    Chicken pox    Chronic left shoulder pain 07/09/2021   Hypertension    Lightheadedness 05/09/2019   Migraine    Other ill-defined heart disease     Social History   Socioeconomic History   Marital status: Married    Spouse name: Not on file   Number of children: Not on file   Years of education: Not on  file   Highest education level: Not on file  Occupational History   Not on file  Tobacco Use   Smoking status: Never   Smokeless tobacco: Never  Substance and Sexual Activity   Alcohol use: Yes    Alcohol/week: 1.0 standard drink of alcohol    Types: 1 Standard drinks or equivalent per week   Drug use: No   Sexual activity: Not on file  Other Topics Concern   Not on file  Social History Narrative   Married.   3 children   Highest level of education 12th grade.   Works as a Arts development officer.   Enjoys playing on the computer, walking, organizing.   Social Drivers of Corporate investment banker Strain: Not on file  Food Insecurity: Not on file  Transportation Needs: Not on file  Physical Activity: Not on file  Stress: Not on file  Social Connections: Not on file  Intimate Partner Violence: Not on file    Past Surgical History:  Procedure Laterality Date   BREAST CYST ASPIRATION     at age 65, possibly left breast   CESAREAN SECTION     TUBAL LIGATION      Family History  Problem Relation Age of Onset   Heart attack Mother    Hypertension Mother     No Known Allergies  Current Outpatient Medications on File Prior to Visit  Medication Sig Dispense Refill   cetirizine  (ZYRTEC ) 10  MG tablet Take 1 tablet (10 mg total) by mouth at bedtime. For allergies. 90 tablet 0   fluticasone  (FLONASE ) 50 MCG/ACT nasal spray Place 1 spray into both nostrils 2 (two) times daily. 16 g 0   No current facility-administered medications on file prior to visit.    BP 128/74   Pulse 69   Temp (!) 97.2 F (36.2 C) (Temporal)   Ht 5\' 6"  (1.676 m)   Wt 230 lb (104.3 kg)   SpO2 98%   BMI 37.12 kg/m  Objective:   Physical Exam HENT:     Right Ear: Tympanic membrane and ear canal normal.     Left Ear: Tympanic membrane and ear canal normal.  Eyes:     Pupils: Pupils are equal, round, and reactive to light.  Cardiovascular:     Rate and Rhythm: Normal rate and regular rhythm.   Pulmonary:     Effort: Pulmonary effort is normal.     Breath sounds: Normal breath sounds.  Abdominal:     General: Bowel sounds are normal.     Palpations: Abdomen is soft.     Tenderness: There is no abdominal tenderness.  Musculoskeletal:        General: Normal range of motion.     Cervical back: Neck supple.  Skin:    General: Skin is warm and dry.  Neurological:     Mental Status: She is alert and oriented to person, place, and time.     Cranial Nerves: No cranial nerve deficit.     Deep Tendon Reflexes:     Reflex Scores:      Patellar reflexes are 2+ on the right side and 2+ on the left side. Psychiatric:        Mood and Affect: Mood normal.           Assessment & Plan:  Preventative health care Assessment & Plan: Declines Shingrix and Pneumonia vaccines.  Mammogram UTD. Bone density scan due, orders placed Colonoscopy UTD, due 2035  Discussed the importance of a healthy diet and regular exercise in order for weight loss, and to reduce the risk of further co-morbidity.  Exam stable. Labs pending.  Follow up in 1 year for repeat physical.    Estrogen deficiency -     DG Bone Density; Future  Essential hypertension Assessment & Plan: Controlled.   She stopped her hydrochlorothiazide  12.5 mg 1 month ago. Remain off hydrochlorothiazide  12.5 mg daily for now. She will continue to monitor at home and will report if BP elevates. Instructions provided.   Orders: -     Comprehensive metabolic panel with GFR -     CBC  Hyperlipidemia, unspecified hyperlipidemia type Assessment & Plan: Off rosuvastatin  for >6 months due to side effects of myalgia. Remain off.  Repeat lipid panel pending.  Discussed the importance of a healthy diet and regular exercise in order for weight loss, and to reduce the risk of further co-morbidity.   Orders: -     Lipid panel  Prediabetes Assessment & Plan: Repeat A1C pending.  Orders: -     Hemoglobin  A1c        Tara John, NP

## 2023-07-13 NOTE — Patient Instructions (Addendum)
 Stop by the lab prior to leaving today. I will notify you of your results once received.   [facility].  Schedule a Welcome to Medicare Visit in 6 months.  It was a pleasure to see you today!

## 2023-07-14 ENCOUNTER — Other Ambulatory Visit: Payer: Self-pay | Admitting: Primary Care

## 2023-12-14 ENCOUNTER — Other Ambulatory Visit: Payer: Self-pay | Admitting: Primary Care

## 2023-12-14 DIAGNOSIS — Z1231 Encounter for screening mammogram for malignant neoplasm of breast: Secondary | ICD-10-CM

## 2023-12-28 ENCOUNTER — Other Ambulatory Visit: Payer: Self-pay | Admitting: Primary Care

## 2023-12-28 DIAGNOSIS — I10 Essential (primary) hypertension: Secondary | ICD-10-CM

## 2023-12-28 DIAGNOSIS — E785 Hyperlipidemia, unspecified: Secondary | ICD-10-CM

## 2023-12-29 NOTE — Telephone Encounter (Signed)
 Please call patient:  Received refill request for hydrochlorothiazide  blood pressure pill and rosuvastatin  cholesterol pill.  During her last visit she notified me that she was no longer taking these.  Can we find out if she is taking these?

## 2024-01-05 NOTE — Telephone Encounter (Signed)
 Noted

## 2024-01-05 NOTE — Telephone Encounter (Signed)
 Spoke with pt. States that she did stop taking these medications but she would like to restart them.

## 2024-01-13 ENCOUNTER — Ambulatory Visit: Admitting: Primary Care

## 2024-01-13 ENCOUNTER — Encounter: Payer: Self-pay | Admitting: Primary Care

## 2024-01-13 VITALS — BP 134/80 | HR 58 | Temp 98.1°F | Ht 64.5 in | Wt 230.0 lb

## 2024-01-13 DIAGNOSIS — E785 Hyperlipidemia, unspecified: Secondary | ICD-10-CM | POA: Diagnosis not present

## 2024-01-13 DIAGNOSIS — Z0000 Encounter for general adult medical examination without abnormal findings: Secondary | ICD-10-CM | POA: Insufficient documentation

## 2024-01-13 DIAGNOSIS — R7303 Prediabetes: Secondary | ICD-10-CM | POA: Diagnosis not present

## 2024-01-13 DIAGNOSIS — I10 Essential (primary) hypertension: Secondary | ICD-10-CM

## 2024-01-13 NOTE — Progress Notes (Signed)
 Chief Complaint  Patient presents with   Medicare Wellness    Welcome to Medicare.     Subjective:   Tara Marshall is a 63 y.o. female who presents for a Welcome to Medicare Exam.   Visit info / Clinical Intake: Medicare Wellness Visit Type:: Welcome to [employer] (IPPE) Persons participating in visit and providing information:: patient Medicare Wellness Visit Mode:: In-person (required for WTM) Interpreter Needed?: No Pre-visit prep was completed: yes AWV questionnaire completed by patient prior to visit?: no Living arrangements:: lives with spouse/significant other Patient's Overall Health Status Rating: (!) fair Typical amount of pain: none Does pain affect daily life?: no Are you currently prescribed opioids?: no  Dietary Habits and Nutritional Risks How many meals a day?: 2 Eats fruit and vegetables daily?: yes Most meals are obtained by: preparing own meals In the last 2 weeks, have you had any of the following?: none Diabetic:: no  Functional Status Activities of Daily Living (to include ambulation/medication): Independent Ambulation: Independent Medication Administration: Independent Home Management (perform basic housework or laundry): Independent Manage your own finances?: yes Primary transportation is: driving Concerns about vision?: no *vision screening is required for WTM* Concerns about hearing?: no  Fall Screening Falls in the past year?: 0 Number of falls in past year: 0 Was there an injury with Fall?: 0 Fall Risk Category Calculator: 0 Patient Fall Risk Level: Low Fall Risk  Fall Risk Patient at Risk for Falls Due to: No Fall Risks Fall risk Follow up: Falls evaluation completed  Home and Transportation Safety: All rugs have non-skid backing?: yes All stairs or steps have railings?: yes Grab bars in the bathtub or shower?: yes Have non-skid surface in bathtub or shower?: yes Good home lighting?: yes Regular seat belt use?: yes Hospital  stays in the last year:: no  Cognitive Assessment Difficulty concentrating, remembering, or making decisions? : no Will 6CIT or Mini Cog be Completed: yes What year is it?: 0 points What month is it?: 0 points Give patient an address phrase to remember (5 components): The red sun is up About what time is it?: 0 points Count backwards from 20 to 1: 0 points Say the months of the year in reverse: 0 points Repeat the address phrase from earlier: 2 points 6 CIT Score: 2 points  Advance Directives (For Healthcare) Does Patient Have a Medical Advance Directive?: No Would patient like information on creating a medical advance directive?: No - Patient declined  Reviewed/Updated  Reviewed/Updated: Medical History; Surgical History; Reviewed All (Medical, Surgical, Family, Medications, Allergies, Care Teams, Patient Goals); Family History; Medications; Allergies; Care Teams; Patient Goals    Allergies (verified) Patient has no known allergies.   Current Medications (verified) Outpatient Encounter Medications as of 01/13/2024  Medication Sig   cetirizine  (ZYRTEC ) 10 MG tablet Take 1 tablet (10 mg total) by mouth at bedtime. For allergies.   fluticasone  (FLONASE ) 50 MCG/ACT nasal spray Place 1 spray into both nostrils 2 (two) times daily.   hydrochlorothiazide  (HYDRODIURIL ) 12.5 MG tablet Take 1 tablet by mouth once daily for blood pressure   rosuvastatin  (CRESTOR ) 5 MG tablet TAKE 1 TABLET BY MOUTH ONCE DAILY FOR CHOLESTEROL   No facility-administered encounter medications on file as of 01/13/2024.    History: Past Medical History:  Diagnosis Date   Arthritis    Carotid bruit    right    Chicken pox    Chronic left shoulder pain 07/09/2021   Hypertension    Knee pain 08/06/2014  Left-sided low back pain with left-sided sciatica 07/12/2014   Lightheadedness 05/09/2019   Migraine    Other ill-defined heart disease    Rectal burning 02/03/2023   Past Surgical History:   Procedure Laterality Date   BREAST CYST ASPIRATION     at age 24, possibly left breast   CESAREAN SECTION     TUBAL LIGATION     Family History  Problem Relation Age of Onset   Heart attack Mother    Hypertension Mother    Social History   Occupational History   Not on file  Tobacco Use   Smoking status: Never   Smokeless tobacco: Never  Substance and Sexual Activity   Alcohol use: Yes    Alcohol/week: 1.0 standard drink of alcohol    Types: 1 Standard drinks or equivalent per week   Drug use: No   Sexual activity: Not on file   Tobacco Counseling Counseling given: Not Answered  SDOH Screenings   Food Insecurity: No Food Insecurity (01/13/2024)  Housing: Low Risk  (01/13/2024)  Depression (PHQ2-9): High Risk (01/13/2024)  Physical Activity: Sufficiently Active (01/13/2024)  Social Connections: Moderately Integrated (01/13/2024)  Stress: Stress Concern Present (01/13/2024)  Tobacco Use: Low Risk  (01/13/2024)  Health Literacy: Adequate Health Literacy (01/13/2024)   See flowsheets for full screening details  Depression Screen PHQ 2 & 9 Depression Scale- Over the past 2 weeks, how often have you been bothered by any of the following problems? Little interest or pleasure in doing things: 2 Feeling down, depressed, or hopeless (PHQ Adolescent also includes...irritable): 1 PHQ-2 Total Score: 3 Trouble falling or staying asleep, or sleeping too much: 3 Feeling tired or having little energy: 2 Poor appetite or overeating (PHQ Adolescent also includes...weight loss): 2 Feeling bad about yourself - or that you are a failure or have let yourself or your family down: 1 Trouble concentrating on things, such as reading the newspaper or watching television (PHQ Adolescent also includes...like school work): 0 Moving or speaking so slowly that other people could have noticed. Or the opposite - being so fidgety or restless that you have been moving around a lot more than usual:  0 Thoughts that you would be better off dead, or of hurting yourself in some way: 1 PHQ-9 Total Score: 12 If you checked off any problems, how difficult have these problems made it for you to do your work, take care of things at home, or get along with other people?: Somewhat difficult  Depression Treatment Depression Interventions/Treatment : Counseling      Goals Addressed             This Visit's Progress    Increase physical activity               Objective:    Today's Vitals   01/13/24 0801  BP: 134/80  Pulse: (!) 58  Temp: 98.1 F (36.7 C)  TempSrc: Oral  SpO2: 97%  Weight: 230 lb (104.3 kg)  Height: 5' 4.5 (1.638 m)   Body mass index is 38.87 kg/m.   Physical Exam Cardiovascular:     Rate and Rhythm: Normal rate and regular rhythm.  Pulmonary:     Effort: Pulmonary effort is normal.     Breath sounds: Normal breath sounds.  Musculoskeletal:     Cervical back: Neck supple.  Skin:    General: Skin is warm and dry.  Neurological:     Mental Status: She is alert and oriented to person, place, and time.  Psychiatric:        Mood and Affect: Mood normal.       Hearing/Vision screen Hearing Screening   125Hz  250Hz  500Hz  1000Hz  2000Hz  3000Hz  4000Hz  5000Hz  6000Hz  8000Hz   Right ear 20 20 20 20 20 30 25 20  45 25  Left ear 20 20 20 20 20 30 25 20 20 25    Vision Screening   Right eye Left eye Both eyes  Without correction 20/25 20/20 20/20   With correction      Immunizations and Health Maintenance Health Maintenance  Topic Date Due   Pneumococcal Vaccine: 50+ Years (1 of 1 - PCV) Never done   Bone Density Scan  Never done   Influenza Vaccine  Never done   Colonoscopy  06/10/2024   Mammogram  01/18/2025   Cervical Cancer Screening (HPV/Pap Cotest)  07/10/2026   DTaP/Tdap/Td (3 - Td or Tdap) 07/03/2030   Hepatitis C Screening  Completed   HIV Screening  Completed   Hepatitis B Vaccines 19-59 Average Risk  Aged Out   Meningococcal B Vaccine   Aged Out   COVID-19 Vaccine  Discontinued   Zoster Vaccines- Shingrix  Discontinued    EKG: Reviewed ECG from 2019. Sinus bradycardia, appears similar to previous tracings      Assessment/Plan:  This is a routine wellness examination for Playita Cortada.  Patient Care Team: Gretta Comer POUR, NP as PCP - General (Nurse Practitioner) Jinny Carmine, MD as Consulting Physician (Gastroenterology)  I have personally reviewed and noted the following in the patient's chart:   Medical and social history Use of alcohol, tobacco or illicit drugs  Current medications and supplements including opioid prescriptions. Functional ability and status Nutritional status Physical activity Advanced directives List of other physicians Hospitalizations, surgeries, and ER visits in previous 12 months Vitals Screenings to include cognitive, depression, and falls Referrals and appointments  Orders Placed This Encounter  Procedures   Lipid panel    Standing Status:   Future    Expiration Date:   01/12/2025   Hemoglobin A1c    Standing Status:   Future    Expiration Date:   01/12/2025   In addition, I have reviewed and discussed with patient certain preventive protocols, quality metrics, and best practice recommendations. A written personalized care plan for preventive services as well as general preventive health recommendations were provided to patient.   Yanni Ruberg K Taya Ashbaugh, NP   01/13/2024   Return in 1 year (on 01/12/2025).

## 2024-01-13 NOTE — Assessment & Plan Note (Signed)
 Declines all vaccines.  Mammogram and bone density scheduled for 2 weeks. Colonoscopy UTD, due 2035  Discussed the importance of a healthy diet and regular exercise in order for weight loss, and to reduce the risk of further co-morbidity.  Exam stable. Labs pending.  Follow up in 1 year for repeat physical.

## 2024-01-13 NOTE — Assessment & Plan Note (Signed)
 Stable.  Reviewed A1C from June 2025.

## 2024-01-13 NOTE — Patient Instructions (Addendum)
 Schedule a lab only appointment for 2 months to repeat your cholesterol.  It was a pleasure to see you today!    Tara Marshall,  Thank you for taking the time for your Medicare Wellness Visit. I appreciate your continued commitment to your health goals. Please review the care plan we discussed, and feel free to reach out if I can assist you further.  Please note that Annual Wellness Visits do not include a physical exam. Some assessments may be limited, especially if the visit was conducted virtually. If needed, we may recommend an in-person follow-up with your provider.  Ongoing Care Seeing your primary care provider every 3 to 6 months helps us  monitor your health and provide consistent, personalized care.   Referrals If a referral was made during today's visit and you haven't received any updates within two weeks, please contact the referred provider directly to check on the status.  Recommended Screenings:  Health Maintenance  Topic Date Due   Pneumococcal Vaccine for age over 54 (1 of 1 - PCV) Never done   Osteoporosis screening with Bone Density Scan  Never done   Flu Shot  Never done   Colon Cancer Screening  06/10/2024   Breast Cancer Screening  01/18/2025   Pap with HPV screening  07/10/2026   DTaP/Tdap/Td vaccine (3 - Td or Tdap) 07/03/2030   Hepatitis C Screening  Completed   HIV Screening  Completed   Hepatitis B Vaccine  Aged Out   Meningitis B Vaccine  Aged Out   COVID-19 Vaccine  Discontinued   Zoster (Shingles) Vaccine  Discontinued       01/13/2024    8:02 AM  Advanced Directives  Does Patient Have a Medical Advance Directive? No  Would patient like information on creating a medical advance directive? No - Patient declined    Vision: Annual vision screenings are recommended for early detection of glaucoma, cataracts, and diabetic retinopathy. These exams can also reveal signs of chronic conditions such as diabetes and high blood pressure.  Dental: Annual  dental screenings help detect early signs of oral cancer, gum disease, and other conditions linked to overall health, including heart disease and diabetes.  Please see the attached documents for additional preventive care recommendations.

## 2024-01-13 NOTE — Assessment & Plan Note (Signed)
 She recently resumed rosuvastatin . Repeat lipid panel in 2 months.  Continue rosuvastatin  5 mg daily.

## 2024-01-13 NOTE — Assessment & Plan Note (Signed)
 Resumed hydrochlorothiazide  yesterday.  Continue hydrochlorothiazide  12.5 mg daily. Reviewed CMP from June 2025.

## 2024-02-01 ENCOUNTER — Ambulatory Visit
Admission: RE | Admit: 2024-02-01 | Discharge: 2024-02-01 | Disposition: A | Discharge status: Home / Self Care | Source: Ambulatory Visit | Attending: Primary Care | Admitting: Primary Care

## 2024-02-01 DIAGNOSIS — Z1231 Encounter for screening mammogram for malignant neoplasm of breast: Secondary | ICD-10-CM | POA: Diagnosis present

## 2024-02-01 DIAGNOSIS — E2839 Other primary ovarian failure: Secondary | ICD-10-CM | POA: Diagnosis present

## 2024-02-08 ENCOUNTER — Ambulatory Visit: Payer: Self-pay | Admitting: Primary Care

## 2024-03-14 ENCOUNTER — Other Ambulatory Visit

## 2025-01-16 ENCOUNTER — Ambulatory Visit: Admitting: Primary Care
